# Patient Record
Sex: Female | Born: 1995 | Race: White | Hispanic: No | Marital: Single | Smoking: Never smoker
Health system: Southern US, Community
[De-identification: ages and names within clinical notes are randomized; demographics above are authoritative.]

## PROBLEM LIST (undated history)

## (undated) DIAGNOSIS — G43909 Migraine, unspecified, not intractable, without status migrainosus: Secondary | ICD-10-CM

## (undated) HISTORY — DX: Migraine, unspecified, not intractable, without status migrainosus: G43.909

---
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
## 2017-05-28 LAB — HM HIV SCREENING LAB: HM HIV Screening: NEGATIVE

## 2017-07-16 DIAGNOSIS — E669 Obesity, unspecified: Secondary | ICD-10-CM | POA: Insufficient documentation

## 2018-11-02 DIAGNOSIS — E669 Obesity, unspecified: Secondary | ICD-10-CM

## 2018-11-02 DIAGNOSIS — F909 Attention-deficit hyperactivity disorder, unspecified type: Secondary | ICD-10-CM

## 2018-11-03 ENCOUNTER — Ambulatory Visit (LOCAL_COMMUNITY_HEALTH_CENTER): Payer: Medicaid Other | Admitting: Nurse Practitioner

## 2018-11-03 ENCOUNTER — Encounter: Payer: Self-pay | Admitting: Nurse Practitioner

## 2018-11-03 ENCOUNTER — Other Ambulatory Visit: Payer: Self-pay

## 2018-11-03 VITALS — BP 130/86 | Ht 64.0 in | Wt 191.0 lb

## 2018-11-03 DIAGNOSIS — Z3009 Encounter for other general counseling and advice on contraception: Secondary | ICD-10-CM | POA: Diagnosis not present

## 2018-11-03 DIAGNOSIS — Z30432 Encounter for removal of intrauterine contraceptive device: Secondary | ICD-10-CM | POA: Diagnosis not present

## 2018-11-03 NOTE — Progress Notes (Signed)
   IUD Removal  Client identified, informed consent performed, consent signed.  Patient was in the dorsal lithotomy position, normal external genitalia was noted.  A speculum was placed in the patient's vagina, normal discharge was noted, no lesions. The cervix was visualized, no lesions, no abnormal discharge.  The strings of the IUD were grasped and pulled using ring forceps. The Mirena IUD was removed in its entirety. Intact device shown to client. Client tolerated the procedure well.    Client  - unsure at this time of method -  for contraception/no plans for pregnancy soon and she was told to avoid teratogens, take PNV and folic acid.  Routine preventative health maintenance measures emphasized. Advised client to RTC in 3 months for yearly physical exam Client verbalizes understanding and is in agreement with plan of care.   I

## 2019-01-25 ENCOUNTER — Other Ambulatory Visit: Payer: Self-pay

## 2019-01-25 ENCOUNTER — Ambulatory Visit (LOCAL_COMMUNITY_HEALTH_CENTER): Payer: Medicaid Other

## 2019-01-25 VITALS — BP 140/82 | Ht 65.0 in | Wt 194.0 lb

## 2019-01-25 DIAGNOSIS — Z3009 Encounter for other general counseling and advice on contraception: Secondary | ICD-10-CM | POA: Diagnosis not present

## 2019-01-25 MED ORDER — ULIPRISTAL ACETATE 30 MG PO TABS
30.0000 mg | ORAL_TABLET | Freq: Once | ORAL | Status: AC
  Administered 2019-01-25: 18:00:00 30 mg via ORAL

## 2019-01-25 NOTE — Progress Notes (Signed)
[name] ECP given per SO and to take home PT 2 weeks from now. Aileen Fass, RN

## 2019-05-16 ENCOUNTER — Other Ambulatory Visit: Payer: Medicaid Other

## 2019-08-09 ENCOUNTER — Ambulatory Visit: Payer: Medicaid Other

## 2019-08-10 ENCOUNTER — Ambulatory Visit: Payer: Medicaid Other

## 2019-09-19 ENCOUNTER — Emergency Department
Admission: EM | Admit: 2019-09-19 | Discharge: 2019-09-19 | Disposition: A | Discharge status: Home / Self Care | Payer: Self-pay | Attending: Student | Admitting: Student

## 2019-09-19 ENCOUNTER — Emergency Department: Payer: Self-pay

## 2019-09-19 ENCOUNTER — Encounter: Payer: Self-pay | Admitting: Emergency Medicine

## 2019-09-19 ENCOUNTER — Other Ambulatory Visit: Payer: Self-pay

## 2019-09-19 DIAGNOSIS — B373 Candidiasis of vulva and vagina: Secondary | ICD-10-CM | POA: Insufficient documentation

## 2019-09-19 DIAGNOSIS — R1031 Right lower quadrant pain: Secondary | ICD-10-CM | POA: Insufficient documentation

## 2019-09-19 DIAGNOSIS — B9689 Other specified bacterial agents as the cause of diseases classified elsewhere: Secondary | ICD-10-CM

## 2019-09-19 DIAGNOSIS — N898 Other specified noninflammatory disorders of vagina: Secondary | ICD-10-CM | POA: Insufficient documentation

## 2019-09-19 DIAGNOSIS — N760 Acute vaginitis: Secondary | ICD-10-CM | POA: Insufficient documentation

## 2019-09-19 DIAGNOSIS — A599 Trichomoniasis, unspecified: Secondary | ICD-10-CM | POA: Insufficient documentation

## 2019-09-19 DIAGNOSIS — A549 Gonococcal infection, unspecified: Secondary | ICD-10-CM | POA: Insufficient documentation

## 2019-09-19 DIAGNOSIS — R109 Unspecified abdominal pain: Secondary | ICD-10-CM

## 2019-09-19 DIAGNOSIS — B379 Candidiasis, unspecified: Secondary | ICD-10-CM

## 2019-09-19 LAB — URINALYSIS, COMPLETE (UACMP) WITH MICROSCOPIC
Bilirubin Urine: NEGATIVE
Glucose, UA: NEGATIVE mg/dL
Ketones, ur: 5 mg/dL — AB
Nitrite: NEGATIVE
Protein, ur: 30 mg/dL — AB
Specific Gravity, Urine: 1.031 — ABNORMAL HIGH (ref 1.005–1.030)
WBC, UA: >50 WBC/hpf — ABNORMAL HIGH (ref 0–5)
pH: 6 (ref 5.0–8.0)

## 2019-09-19 LAB — CBC
HCT: 47.5 % — ABNORMAL HIGH (ref 36.0–46.0)
Hemoglobin: 15.3 g/dL — ABNORMAL HIGH (ref 12.0–15.0)
MCH: 26.9 pg (ref 26.0–34.0)
MCHC: 32.2 g/dL (ref 30.0–36.0)
MCV: 83.6 fL (ref 80.0–100.0)
Platelets: 261 10*3/uL (ref 150–400)
RBC: 5.68 MIL/uL — ABNORMAL HIGH (ref 3.87–5.11)
RDW: 13.4 % (ref 11.5–15.5)
WBC: 12.1 10*3/uL — ABNORMAL HIGH (ref 4.0–10.5)
nRBC: 0 % (ref 0.0–0.2)

## 2019-09-19 LAB — COMPREHENSIVE METABOLIC PANEL
ALT: 16 U/L (ref 0–44)
AST: 21 U/L (ref 15–41)
Albumin: 4.2 g/dL (ref 3.5–5.0)
Alkaline Phosphatase: 65 U/L (ref 38–126)
Anion gap: 7 (ref 5–15)
BUN: 9 mg/dL (ref 6–20)
CO2: 28 mmol/L (ref 22–32)
Calcium: 9.3 mg/dL (ref 8.9–10.3)
Chloride: 106 mmol/L (ref 98–111)
Creatinine, Ser: 0.92 mg/dL (ref 0.44–1.00)
GFR calc Af Amer: >60 mL/min (ref >60)
GFR calc non Af Amer: >60 mL/min (ref >60)
Glucose, Bld: 106 mg/dL — ABNORMAL HIGH (ref 70–99)
Potassium: 3.4 mmol/L — ABNORMAL LOW (ref 3.5–5.1)
Sodium: 141 mmol/L (ref 135–145)
Total Bilirubin: 0.7 mg/dL (ref 0.3–1.2)
Total Protein: 7.5 g/dL (ref 6.5–8.1)

## 2019-09-19 LAB — WET PREP, GENITAL: Sperm: NONE SEEN

## 2019-09-19 LAB — CHLAMYDIA/NGC RT PCR (ARMC ONLY)
Chlamydia Tr: NOT DETECTED
N gonorrhoeae: DETECTED — AB

## 2019-09-19 LAB — LIPASE, BLOOD: Lipase: 21 U/L (ref 11–51)

## 2019-09-19 LAB — POCT PREGNANCY, URINE: Preg Test, Ur: NEGATIVE

## 2019-09-19 LAB — HCG, QUANTITATIVE, PREGNANCY: hCG, Beta Chain, Quant, S: <1 m[IU]/mL (ref <5)

## 2019-09-19 MED ORDER — FLUCONAZOLE 50 MG PO TABS
200.0000 mg | ORAL_TABLET | Freq: Once | ORAL | Status: AC
  Administered 2019-09-19: 200 mg via ORAL
  Filled 2019-09-19: qty 4

## 2019-09-19 MED ORDER — METRONIDAZOLE 500 MG PO TABS
500.0000 mg | ORAL_TABLET | Freq: Two times a day (BID) | ORAL | 0 refills | Status: AC
Start: 2019-09-19 — End: 2019-10-03

## 2019-09-19 MED ORDER — AZITHROMYCIN 500 MG PO TABS
1000.0000 mg | ORAL_TABLET | Freq: Once | ORAL | Status: AC
  Administered 2019-09-19: 1000 mg via ORAL
  Filled 2019-09-19: qty 2

## 2019-09-19 MED ORDER — METRONIDAZOLE 500 MG PO TABS
500.0000 mg | ORAL_TABLET | Freq: Once | ORAL | Status: AC
  Administered 2019-09-19: 500 mg via ORAL
  Filled 2019-09-19: qty 1

## 2019-09-19 MED ORDER — CEFTRIAXONE SODIUM 250 MG IJ SOLR
250.0000 mg | Freq: Once | INTRAMUSCULAR | Status: AC
  Administered 2019-09-19: 250 mg via INTRAVENOUS
  Filled 2019-09-19: qty 250

## 2019-09-19 MED ORDER — DEXTROSE 5 % IV SOLN
250.0000 mg | Freq: Once | INTRAVENOUS | Status: AC
  Administered 2019-09-19: 250 mg via INTRAVENOUS
  Filled 2019-09-19: qty 250

## 2019-09-19 MED ORDER — IOHEXOL 300 MG/ML  SOLN
100.0000 mL | Freq: Once | INTRAMUSCULAR | Status: AC | PRN
  Administered 2019-09-19: 100 mL via INTRAVENOUS
  Filled 2019-09-19: qty 100

## 2019-09-19 MED ORDER — DOXYCYCLINE HYCLATE 100 MG PO CAPS
100.0000 mg | ORAL_CAPSULE | Freq: Two times a day (BID) | ORAL | 0 refills | Status: AC
Start: 2019-09-19 — End: 2019-10-03

## 2019-09-19 MED ORDER — SODIUM CHLORIDE 0.9% FLUSH: 3.0000 mL | Freq: Once | INTRAVENOUS | Status: DC

## 2019-09-19 MED ORDER — FLUCONAZOLE 200 MG PO TABS
200.0000 mg | ORAL_TABLET | Freq: Once | ORAL | 0 refills | Status: AC
Start: 2019-10-03 — End: 2019-10-03

## 2019-09-19 NOTE — ED Notes (Signed)
Pt transported to [facility]

## 2019-09-19 NOTE — ED Provider Notes (Signed)
[facility] Emergency Department Provider Note  ____________________________________________   First MD Initiated Contact with Patient 09/19/19 1704     (approximate)  I have reviewed the triage vital signs and the nursing notes.  History  Chief Complaint Abdominal Pain    HPI Lauren Taylor is a 24 y.o. female PMHx as below, who presents to the ER for lower abdominal cramping pain, primarily R sided, as well as positive home pregnancy test. Patient states lower abdominal cramping first started over the weekend, located more so in the RLQ.  Described as cramping, 6/10 in severity, no radiation, no alleviating or aggravating components. Constant since onset.  Did has an episode of vomiting over the weekend, none since.  Took a home pregnancy test which was faintly positive.  Denies any vaginal bleeding.  Does report some vaginal discharge, but not out of the ordinary for her. LMP October 2020, though she is irregular and recently had IUD removed. Does ask for STD screening and empiric treatment, however initially declines pelvic exam.  No fevers, no dysuria.   Past Medical Hx Past Medical History:  Diagnosis Date  . Migraine     Problem List Patient Active Problem List   Diagnosis Date Noted  . Obesity, unspecified 07/16/2017  . Attention deficit hyperactivity disorder 01/27/2016  . Acute depression 07/18/2014    Past Surgical Hx History reviewed. No pertinent surgical history.  Medications Prior to Admission medications   Medication Sig Start Date End Date Taking? Authorizing Provider  phenazopyridine (PYRIDIUM) 200 MG tablet Take 1 tablet (200 mg total) by mouth 3 (three) times daily as needed for pain. 10/20/15   Menshew, Dannielle Karvonen, PA-C    Allergies Patient has no known allergies.  Family Hx Family History  Problem Relation Age of Onset  . Anxiety disorder Mother   . Depression Mother     Social Hx Social History   Tobacco Use  .  Smoking status: Never Smoker  . Smokeless tobacco: Never Used  Substance Use Topics  . Alcohol use: Not Currently  . Drug use: Not Currently     Review of Systems  Constitutional: Negative for fever. Negative for chills. Eyes: Negative for visual changes. ENT: Negative for sore throat. Cardiovascular: Negative for chest pain. Respiratory: Negative for shortness of breath. Gastrointestinal: Negative for nausea. Negative for vomiting. + abdominal cramping Genitourinary: Negative for dysuria. Musculoskeletal: Negative for leg swelling. Skin: Negative for rash. Neurological: Negative for headaches.   Physical Exam  Vital Signs: ED Triage Vitals  Enc Vitals Group     BP 09/19/19 1604 126/81     Pulse Rate 09/19/19 1604 (!) 101     Resp 09/19/19 1604 20     Temp 09/19/19 1604 98.9 F (37.2 C)     Temp Source 09/19/19 1604 Oral     SpO2 09/19/19 1604 99 %     Weight 09/19/19 1559 175 lb (79.4 kg)     Height 09/19/19 1559 5\' 3"  (1.6 m)     Head Circumference --      Peak Flow --      Pain Score 09/19/19 1559 6     Pain Loc --      Pain Edu? --      Excl. in [place]? --     Constitutional: Alert and oriented. Well appearing. NAD.  Head: Normocephalic. Atraumatic. Eyes: Conjunctivae clear. Sclera anicteric. Pupils equal and symmetric. Nose: No masses or lesions. No congestion or rhinorrhea. Mouth/Throat: Wearing mask.  Neck:  No stridor. Trachea midline.  Cardiovascular: Normal rate, regular rhythm. Extremities well perfused. Respiratory: Normal respiratory effort.  Lungs CTAB. Gastrointestinal: Soft. Non-distended.  Mild discomfort with palpation in the RLQ, but no rebound, guarding, rigidity.  Remainder abdomen soft and nontender. Genitourinary: RN chaperone present. Normal external genitalia, no rashes or lesions. Moderate amount of discharge in vaginal canal -  thick, white, clumpy discharge mixed w/ thinner white discharge. No CMT. No adnexa tenderness.  Musculoskeletal: No  lower extremity edema. No deformities. Neurologic:  Normal speech and language. No gross focal or lateralizing neurologic deficits are appreciated.  Skin: Skin is warm, dry and intact. No rash noted. Psychiatric: Mood and affect are appropriate for situation.    Radiology  Personally reviewed available imaging myself.   CT A/P - IMPRESSION:  No acute process in the abdomen or pelvis. '  US - IMPRESSION:  Findings likely consistent with a small uterine fibroid.    Procedures  Procedure(s) performed (including critical care):  Procedures   Initial Impression / Assessment and Plan / MDM / ED Course  24 y.o. female who presents to the ED for right lower quadrant abdominal cramping, and positive home pregnancy test  Ddx: pregnancy, ectopic, appendicitis, pelvic infection, UTI, kidney stone  Will plan for labs, UA, imaging.  Discussed pelvic exam with patient, at this time she would prefer to obtain UA gonorrhea/chlamydia.  Pregnancy test is negative.  CT abdomen/pelvis negative for acute abnormalities.  Urine positive for gonorrhea, negative for chlamydia.  UA with WBCs, bacteria, LE likely secondary to gonorrhea.  Azithromycin and ceftriaxone ordered.  Updated patient on results, discussed the need for further evaluation with pelvic ultrasound to rule out TOA given her right-sided pain.  At this time, she is agreeable to pelvic exam for further/more complete evaluation, and wet prep swab.   Pelvic US reveals probably small fibroid, otherwise unremarkable. Wet prep is positive for yeast, trich, and clue cells. Will treat BV and trich with course of metronidazole. Will treat yeast w/ fluconazole here, and additional Rx for 2nd dose after completion of antibiotics. Given her multiple infections, will treat with doxy x 14 days as well. She is in agreement. She is aware of notifying any partner(s) as well for treatment. Advised outpatient follow up and given return precautions.        _______________________________   As part of my medical decision making I have reviewed available labs, radiology tests, reviewed old records/performed chart review.    Final Clinical Impression(s) / ED Diagnosis  Final diagnoses:  Abdominal cramping  Gonorrhea  Yeast infection  Trichomoniasis  Bacterial vaginosis       Note:  This document was prepared using Dragon voice recognition software and may include unintentional dictation errors.   Miguel Aschoff., MD 09/20/19 1044

## 2019-09-19 NOTE — ED Triage Notes (Signed)
Pt presents to ED via POV with c/o lower abdominal cramping. Pt states took 3 home preg tests on Monday that were +, reports today had lower abdominal cramping. Denies vaginal bleeding at this time.

## 2019-09-19 NOTE — Discharge Instructions (Addendum)
Thank you for letting us take care of you in the emergency department today.  Your testing was positive for gonorrhea, yeast, bacterial vaginosis, and trichomonas.  We will be sending you home with prescription medications for these.  It is also important that you update your partner(s) on your positive results, as they will need to be treated as well.  Please continue to take any regular, prescribed medications.   New medications we have prescribed:  Doxycycline Metronidazole (Flagyl) Fluconazole - this is for treatment of yeast infection, you should take this after completion of antibiotics  Please follow up with: Your primary care doctor to review your ER visit and follow up on your symptoms.    Please return to the ER for any new or worsening symptoms.

## 2019-09-22 ENCOUNTER — Ambulatory Visit: Payer: Medicaid Other

## 2019-12-15 ENCOUNTER — Ambulatory Visit: Payer: Medicaid Other

## 2020-07-10 IMAGING — US US TRANSVAGINAL NON-OB
1 series · 14 of 25 positions shown · non-contrast
Comparison: None.

CLINICAL DATA: Right upper quadrant pain.

EXAM:
TRANSABDOMINAL ULTRASOUND OF PELVIS
DOPPLER ULTRASOUND OF OVARIES
TECHNIQUE: Transabdominal ultrasound examination of the pelvis was performed
including evaluation of the uterus, ovaries, adnexal regions, and
pelvic cul-de-sac.
Color and duplex Doppler ultrasound was utilized to evaluate blood
flow to the ovaries.

[Series 1: us pelvis (transabdominal only) · 14 of 42 slices shown]
[im 1/42]
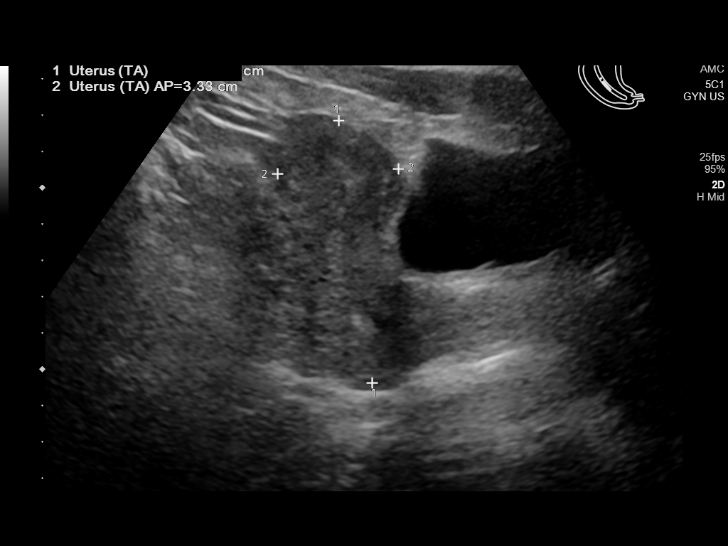
[im 4/42]
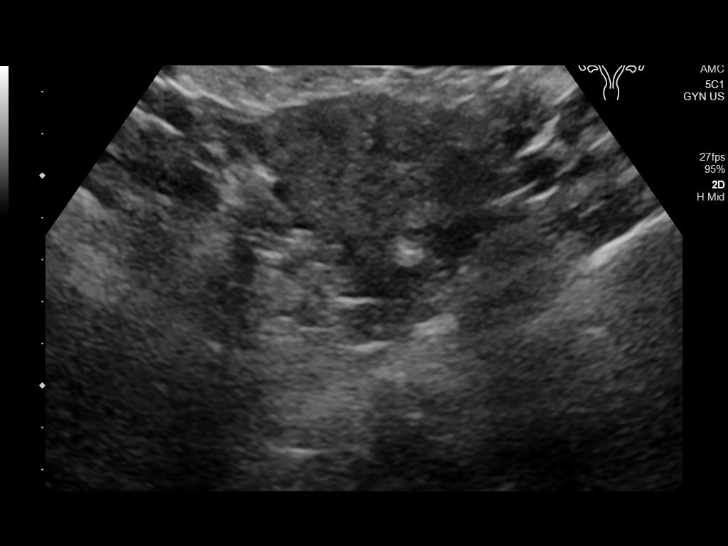
[im 7/42]
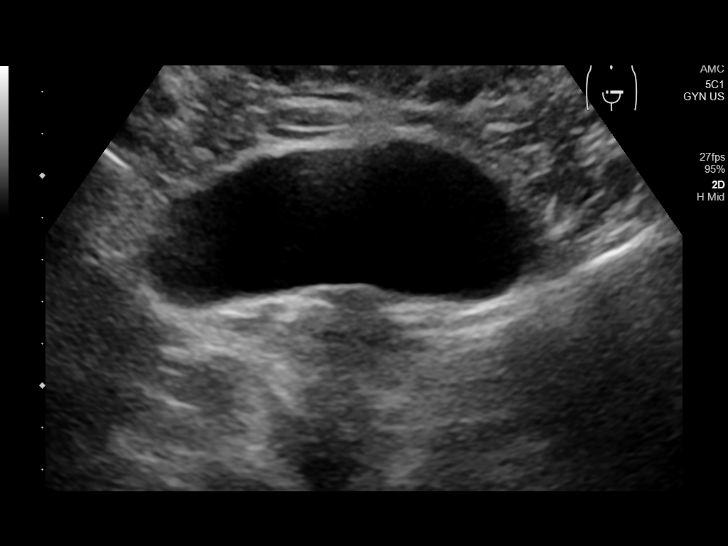
[im 11/42]
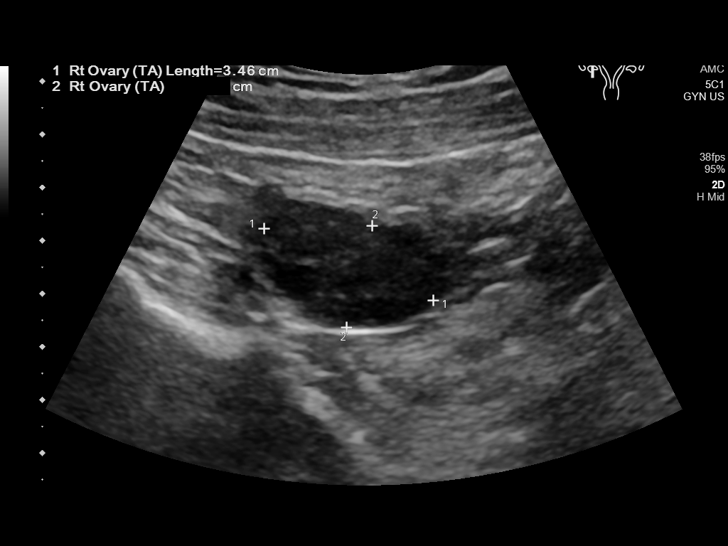
[im 14/42]
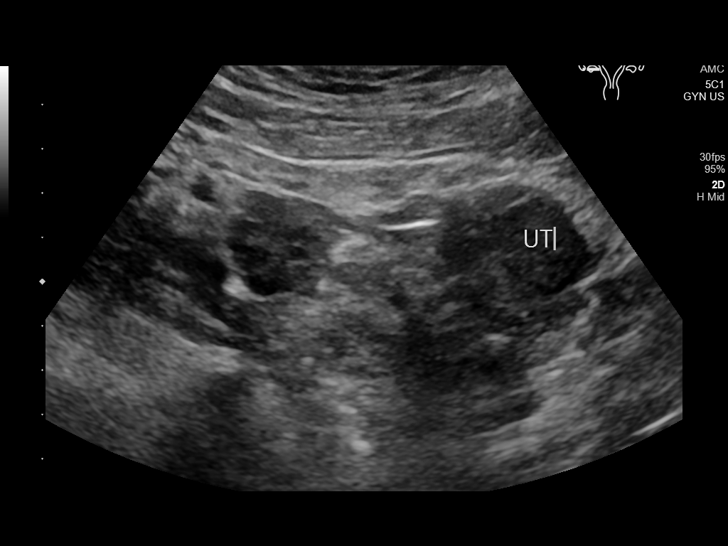
[im 16/42]
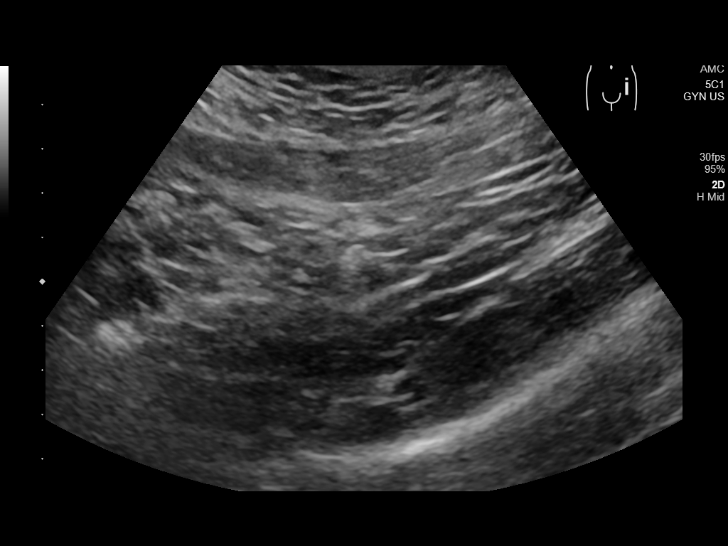
[im 19/42]
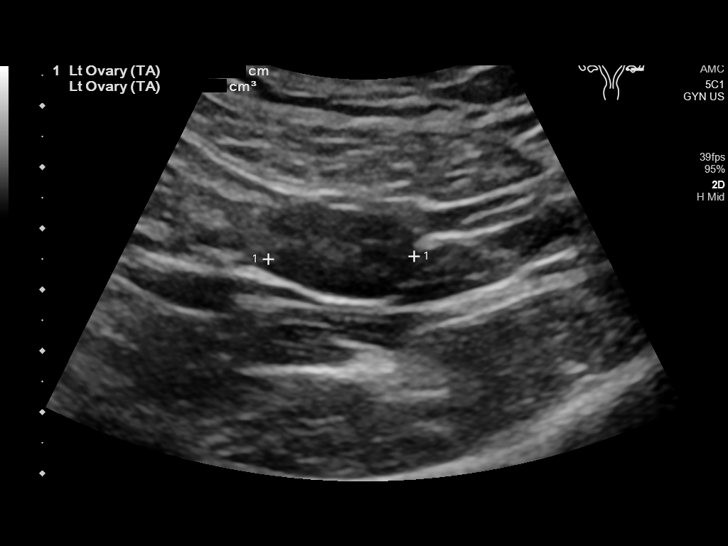
[im 23/42]
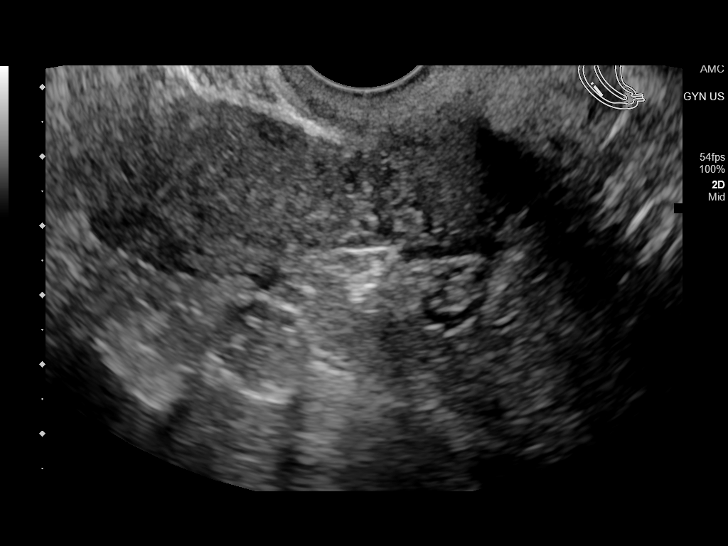
[im 26/42]
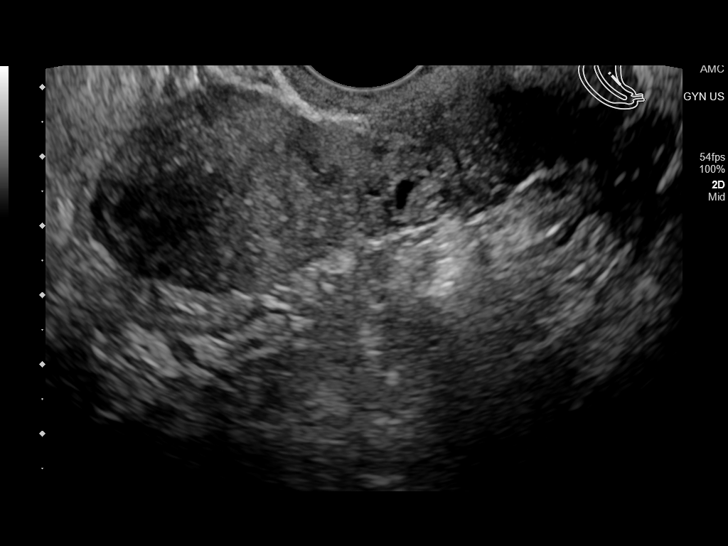
[im 28/42]
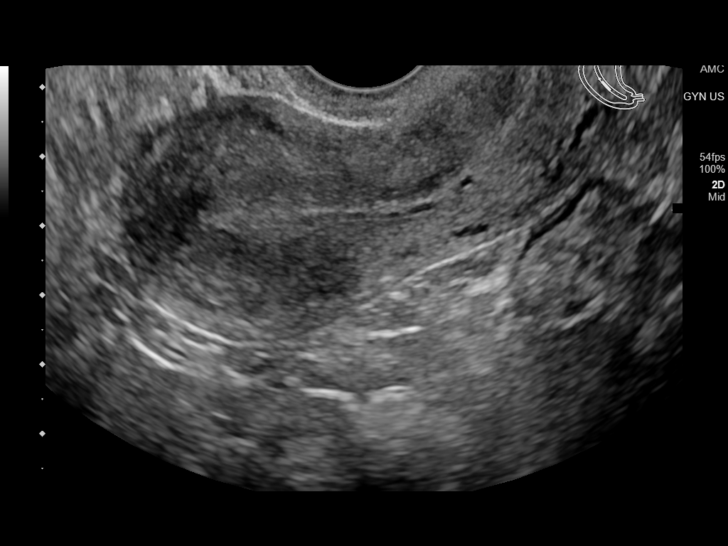
[im 31/42]
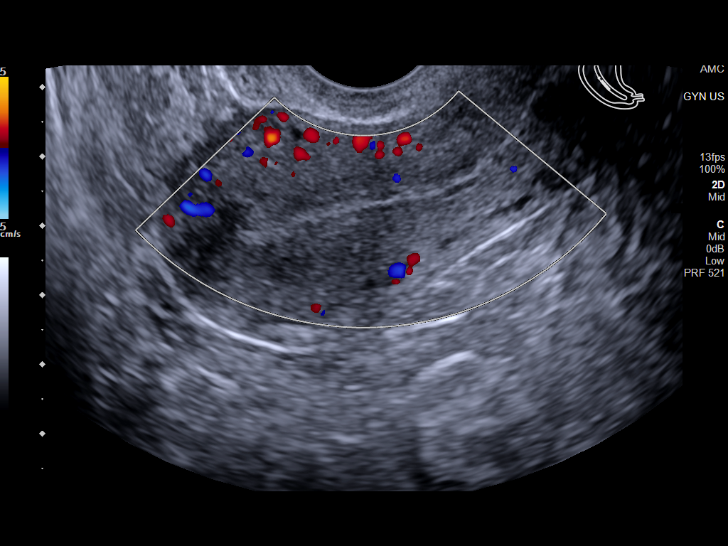
[im 35/42]
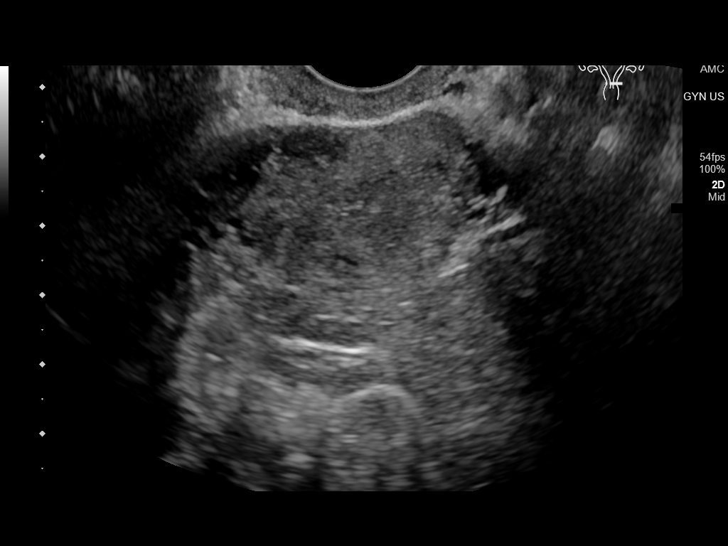
[im 38/42]
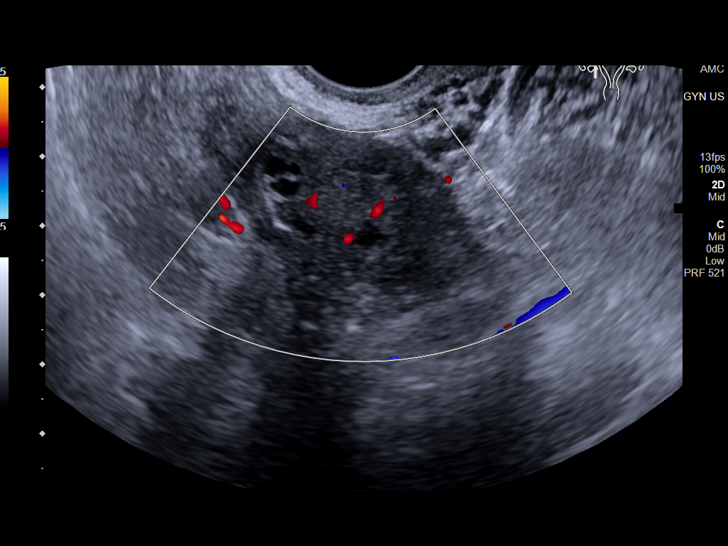
[im 42/42]
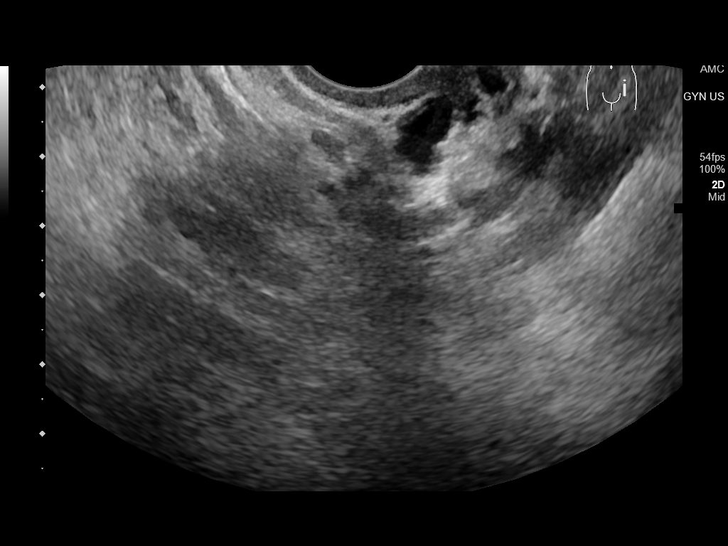

[14 of 25 positions shown; findings below may reference images not displayed]

FINDINGS: Uterus

Measurements: 7.3 cm x 3.3 cm x 4.4 cm = volume: 56 mL. A 0.7 cm x
0.7 cm x 0.7 cm area of heterogeneous echogenicity is seen within
the uterine parenchyma on the right.

Endometrium

Thickness: 4.0 mm.  No focal abnormality visualized.

Right ovary

Measurements: 3.5 cm x 2.0 cm x 2.2 cm = volume: 8 mL. Normal
appearance/no adnexal mass.

Left ovary

Measurements: 3.6 cm x 1.6 cm x 2.4 cm = volume: 7 mL. Normal
appearance/no adnexal mass.

Pulsed Doppler evaluation demonstrates normal low-resistance
arterial and venous waveforms in both ovaries.

Other: No free fluid is seen.
IMPRESSION: Findings likely consistent with a small uterine fibroid.

## 2021-05-04 NOTE — L&D Delivery Note (Signed)
Delivery Note Primary OB: other Delivery Physician: Barnett Applebaum, MD Gestational Age: Premature at [redacted] weeks gestation Antepartum complications: PPROM, PTL/PTD Intrapartum complications: Preterm Labor and PROM  A viable Female was delivered via vertex presentation. "Analayah" Apgars:pending; infant w neonatal team  Weight:  1 lb 15 oz .   Placenta status: spontaneous and Intact.  One small area of clot, otherwise healthy size and appearance  Cord: 3+ vessels;  with the following complications: none.  Anesthesia:  epidural Episiotomy:  none Lacerations:  none Suture Repair: none Est. Blood Loss (mL):   500 mL  Mom to postpartum.  Baby to NICU/SCN.  Barnett Applebaum, MD, [facility] Pardon [facility], [facility] 06/27/2021  8:17 AM [phone]

## 2021-06-24 ENCOUNTER — Encounter: Payer: Self-pay | Admitting: Obstetrics and Gynecology

## 2021-06-24 ENCOUNTER — Observation Stay
Admission: EM | Admit: 2021-06-24 | Discharge: 2021-06-24 | Disposition: A | Discharge status: Home / Self Care | Payer: Medicaid Other | Attending: Obstetrics and Gynecology | Admitting: Obstetrics and Gynecology

## 2021-06-24 ENCOUNTER — Other Ambulatory Visit: Payer: Self-pay

## 2021-06-24 DIAGNOSIS — O212 Late vomiting of pregnancy: Secondary | ICD-10-CM | POA: Diagnosis present

## 2021-06-24 DIAGNOSIS — R82998 Other abnormal findings in urine: Secondary | ICD-10-CM | POA: Diagnosis not present

## 2021-06-24 DIAGNOSIS — Z3A26 26 weeks gestation of pregnancy: Secondary | ICD-10-CM | POA: Diagnosis not present

## 2021-06-24 DIAGNOSIS — E876 Hypokalemia: Secondary | ICD-10-CM | POA: Insufficient documentation

## 2021-06-24 DIAGNOSIS — O219 Vomiting of pregnancy, unspecified: Secondary | ICD-10-CM | POA: Diagnosis present

## 2021-06-24 DIAGNOSIS — O99282 Endocrine, nutritional and metabolic diseases complicating pregnancy, second trimester: Secondary | ICD-10-CM | POA: Diagnosis not present

## 2021-06-24 LAB — URINALYSIS, COMPLETE (UACMP) WITH MICROSCOPIC
Bilirubin Urine: NEGATIVE
Glucose, UA: NEGATIVE mg/dL
Ketones, ur: 80 mg/dL — AB
Nitrite: NEGATIVE
Protein, ur: 100 mg/dL — AB
Specific Gravity, Urine: 1.029 (ref 1.005–1.030)
WBC, UA: >50 WBC/hpf — ABNORMAL HIGH (ref 0–5)
pH: 5 (ref 5.0–8.0)

## 2021-06-24 LAB — COMPREHENSIVE METABOLIC PANEL
ALT: 27 U/L (ref 0–44)
AST: 38 U/L (ref 15–41)
Albumin: 3.2 g/dL — ABNORMAL LOW (ref 3.5–5.0)
Alkaline Phosphatase: 70 U/L (ref 38–126)
Anion gap: 10 (ref 5–15)
BUN: 8 mg/dL (ref 6–20)
CO2: 20 mmol/L — ABNORMAL LOW (ref 22–32)
Calcium: 8.2 mg/dL — ABNORMAL LOW (ref 8.9–10.3)
Chloride: 107 mmol/L (ref 98–111)
Creatinine, Ser: 0.57 mg/dL (ref 0.44–1.00)
GFR, Estimated: >60 mL/min (ref >60)
Glucose, Bld: 105 mg/dL — ABNORMAL HIGH (ref 70–99)
Potassium: 3 mmol/L — ABNORMAL LOW (ref 3.5–5.1)
Sodium: 137 mmol/L (ref 135–145)
Total Bilirubin: 0.5 mg/dL (ref 0.3–1.2)
Total Protein: 6.9 g/dL (ref 6.5–8.1)

## 2021-06-24 LAB — CBC
HCT: 34.5 % — ABNORMAL LOW (ref 36.0–46.0)
Hemoglobin: 11.4 g/dL — ABNORMAL LOW (ref 12.0–15.0)
MCH: 27 pg (ref 26.0–34.0)
MCHC: 33 g/dL (ref 30.0–36.0)
MCV: 81.8 fL (ref 80.0–100.0)
Platelets: 160 10*3/uL (ref 150–400)
RBC: 4.22 MIL/uL (ref 3.87–5.11)
RDW: 13.1 % (ref 11.5–15.5)
WBC: 8.9 10*3/uL (ref 4.0–10.5)
nRBC: 0 % (ref 0.0–0.2)

## 2021-06-24 MED ORDER — PROMETHAZINE HCL 25 MG PO TABS: 12.5000 mg | ORAL_TABLET | Freq: Four times a day (QID) | ORAL | 0 refills | Status: DC | PRN

## 2021-06-24 MED ORDER — ONDANSETRON HCL 4 MG/2ML IJ SOLN
4.0000 mg | Freq: Four times a day (QID) | INTRAMUSCULAR | Status: DC | PRN
  Administered 2021-06-24: 4 mg via INTRAVENOUS
  Filled 2021-06-24: qty 2

## 2021-06-24 MED ORDER — SODIUM CHLORIDE 0.9 % IV SOLN
12.5000 mg | Freq: Once | INTRAVENOUS | Status: AC
  Filled 2021-06-24: qty 0.5

## 2021-06-24 MED ORDER — PROMETHAZINE HCL 25 MG/ML IJ SOLN
INTRAMUSCULAR | Status: AC
  Administered 2021-06-24: 12.5 mg
  Filled 2021-06-24: qty 1

## 2021-06-24 MED ORDER — LACTATED RINGERS IV SOLN: INTRAVENOUS | Status: DC

## 2021-06-24 NOTE — OB Triage Note (Signed)
Pt is a G2P1 at 72.5 w who receives [facility] who presents with N/V. Since 06/23/2021 at 1900. Still feels nauseated and stated vomited about 2 min ago and now has blood sputum. Pt states uncomplicated pregnancy with no N/V. Pt denies exposure to anyone sick. Reports + FM

## 2021-06-24 NOTE — Discharge Summary (Signed)
Physician Final Progress Note  Patient ID: Lauren Taylor MRN: [id number] DOB/AGE: 1995-08-19 26 y.o.  Admit date: 06/24/2021 Admitting provider: Rod Can, CNM Discharge date: 06/24/2021   Admission Diagnoses:  1) intrauterine pregnancy at [redacted]w[redacted]d  2) nausea and vomiting 3) hypokalemia  Discharge Diagnoses:  Principal Problem:   Nausea and vomiting during pregnancy Active Problems:   [redacted] weeks gestation of pregnancy    History of Present Illness: The patient is a 27 y.o. female G2P1001 at [redacted]w[redacted]d who presents for nausea and vomiting since last night. She reports blood in emesis. She denies upper respiratory symptoms. She denies exposure to sickness, although her partner who is in the room reports that yesterday he had diarrhea and did not feel well. She has been unable to keep down food or fluids. She reports good fetal movement. She denies vaginal bleeding, leakage of fluid or contractions. She was admitted for observation, placed on monitors, labs done. Discussed results of labs with patient and recommendations given; rest, hydrate, supplement with potassium. Follow up as needed after urine culture results.  Past Medical History:  Diagnosis Date   Migraine     No past surgical history on file.  No current facility-administered medications on file prior to encounter.   No current outpatient medications on file prior to encounter.    No Known Allergies  Social History   Socioeconomic History   Marital status: Single    Spouse name: Not on file   Number of children: Not on file   Years of education: Not on file   Highest education level: Not on file  Occupational History   Not on file  Tobacco Use   Smoking status: Never   Smokeless tobacco: Never  Substance and Sexual Activity   Alcohol use: Not Currently   Drug use: Not Currently   Sexual activity: Not on file  Other Topics Concern   Not on file  Social History Narrative   Not on file   Social Determinants of  Health   Financial Resource Strain: Not on file  Food Insecurity: Not on file  Transportation Needs: Not on file  Physical Activity: Not on file  Stress: Not on file  Social Connections: Not on file  Intimate Partner Violence: Not on file    Family History  Problem Relation Age of Onset   Anxiety disorder Mother    Depression Mother      Review of Systems  Constitutional:  Positive for fever. Negative for chills.  HENT:  Negative for congestion, ear discharge, ear pain, hearing loss, sinus pain and sore throat.   Eyes:  Negative for blurred vision and double vision.  Respiratory:  Negative for cough, shortness of breath and wheezing.   Cardiovascular:  Negative for chest pain, palpitations and leg swelling.  Gastrointestinal:  Positive for nausea and vomiting. Negative for abdominal pain, blood in stool, constipation, diarrhea, heartburn and melena.  Genitourinary:  Negative for dysuria, flank pain, frequency, hematuria and urgency.  Musculoskeletal:  Negative for back pain, joint pain and myalgias.  Skin:  Negative for itching and rash.  Neurological:  Negative for dizziness, tingling, tremors, sensory change, speech change, focal weakness, seizures, loss of consciousness, weakness and headaches.  Endo/Heme/Allergies:  Negative for environmental allergies. Does not bruise/bleed easily.  Psychiatric/Behavioral:  Negative for depression, hallucinations, memory loss, substance abuse and suicidal ideas. The patient is not nervous/anxious and does not have insomnia.     Physical Exam: BP 123/69    Pulse (!) 112  Temp 99.7 F (37.6 C)    Resp 18   Vital Signs: BP 123/69    Pulse (!) 112    Temp 99.7 F (37.6 C)    Resp 18  Constitutional: Well nourished, well developed female in mild acute distress.  HEENT: normal Skin: Warm and dry.  Cardiovascular: Regular rate and rhythm.   Extremity:  no edema   Respiratory: Clear to auscultation bilateral. Normal respiratory  effort Abdomen: FHT present Neuro: DTRs 2+, Cranial nerves grossly intact Psych: Alert and Oriented x3. No memory deficits. Normal mood and affect.   Fetal well being: 150 bpm, moderate variability, no accelerations, no decelerations Toco: negative  Consults: None  Significant Findings/ Diagnostic Studies: labs:   Latest Reference Range & Units 06/24/21 04:17  COMPREHENSIVE METABOLIC PANEL  Rpt !  Sodium 135 - 145 mmol/L 137  Potassium 3.5 - 5.1 mmol/L 3.0 (L)  Chloride 98 - 111 mmol/L 107  CO2 22 - 32 mmol/L 20 (L)  Glucose 70 - 99 mg/dL 105 (H)  BUN 6 - 20 mg/dL 8  Creatinine 0.44 - 1.00 mg/dL 0.57  Calcium 8.9 - 10.3 mg/dL 8.2 (L)  Anion gap 5 - 15  10  Alkaline Phosphatase 38 - 126 U/L 70  Albumin 3.5 - 5.0 g/dL 3.2 (L)  AST 15 - 41 U/L 38  ALT 0 - 44 U/L 27  Total Protein 6.5 - 8.1 g/dL 6.9  Total Bilirubin 0.3 - 1.2 mg/dL 0.5  GFR, Estimated >60 mL/min >60  WBC 4.0 - 10.5 K/uL 8.9  RBC 3.87 - 5.11 MIL/uL 4.22  Hemoglobin 12.0 - 15.0 g/dL 11.4 (L)  HCT 36.0 - 46.0 % 34.5 (L)  MCV 80.0 - 100.0 fL 81.8  MCH 26.0 - 34.0 pg 27.0  MCHC 30.0 - 36.0 g/dL 33.0  RDW 11.5 - 15.5 % 13.1  Platelets 150 - 400 K/uL 160  nRBC 0.0 - 0.2 % 0.0  Appearance CLEAR  TURBID !  Bilirubin Urine NEGATIVE  NEGATIVE  Color, Urine YELLOW  AMBER !  Glucose, UA NEGATIVE mg/dL NEGATIVE  Hgb urine dipstick NEGATIVE  SMALL !  Ketones, ur NEGATIVE mg/dL 80 !  Leukocytes,Ua NEGATIVE  MODERATE !  Nitrite NEGATIVE  NEGATIVE  pH 5.0 - 8.0  5.0  Protein NEGATIVE mg/dL 100 !  Specific Gravity, Urine 1.005 - 1.030  1.029  Bacteria, UA NONE SEEN  MANY !  Mucus  PRESENT  RBC / HPF 0 - 5 RBC/hpf 21-50  Squamous Epithelial / LPF 0 - 5  11-20  WBC Clumps  PRESENT  WBC, UA 0 - 5 WBC/hpf >50 (H)  URINE CULTURE  Rpt  !: Data is abnormal (L): Data is abnormally low (H): Data is abnormally high Rpt: View report in Results Review for more information  Procedures: NST  Hospital Course: The patient  was admitted to Labor and Delivery Triage for observation.   Discharge Condition: good  Disposition: Discharge disposition: 01-Home or Self Care  Diet: advance as tolerated  Discharge Activity: Activity as tolerated  Discharge Instructions     Discharge activity:   Complete by: As directed    Activity as tolerated   Discharge diet:   Complete by: As directed    Advance diet as tolerated      Allergies as of 06/24/2021   No Known Allergies      Medication List     STOP taking these medications    phenazopyridine 200 MG tablet Commonly known as: Pyridium  TAKE these medications    promethazine 25 MG tablet Commonly known as: PHENERGAN Take 0.5 tablets (12.5 mg total) by mouth every 6 (six) hours as needed for nausea or vomiting.        Total time spent taking care of this patient: 21 minutes  Signed: Rod Can, CNM  06/24/2021, 7:17 AM

## 2021-06-25 LAB — URINE CULTURE

## 2021-06-26 DIAGNOSIS — O99214 Obesity complicating childbirth: Secondary | ICD-10-CM | POA: Diagnosis present

## 2021-06-26 DIAGNOSIS — O4592 Premature separation of placenta, unspecified, second trimester: Secondary | ICD-10-CM | POA: Diagnosis present

## 2021-06-26 DIAGNOSIS — Z3A27 27 weeks gestation of pregnancy: Secondary | ICD-10-CM

## 2021-06-26 DIAGNOSIS — O42912 Preterm premature rupture of membranes, unspecified as to length of time between rupture and onset of labor, second trimester: Principal | ICD-10-CM | POA: Diagnosis present

## 2021-06-26 DIAGNOSIS — Z20822 Contact with and (suspected) exposure to covid-19: Secondary | ICD-10-CM | POA: Diagnosis not present

## 2021-06-26 DIAGNOSIS — R109 Unspecified abdominal pain: Secondary | ICD-10-CM | POA: Diagnosis present

## 2021-06-27 ENCOUNTER — Other Ambulatory Visit: Payer: Self-pay

## 2021-06-27 ENCOUNTER — Inpatient Hospital Stay: Payer: Medicaid Other | Admitting: Anesthesiology

## 2021-06-27 ENCOUNTER — Encounter: Payer: Self-pay | Admitting: Obstetrics and Gynecology

## 2021-06-27 ENCOUNTER — Inpatient Hospital Stay
Admission: EM | Admit: 2021-06-27 | Discharge: 2021-06-27 | DRG: 831 | Disposition: A | Discharge status: Home / Self Care | Payer: Medicaid Other | Attending: Obstetrics & Gynecology | Admitting: Obstetrics & Gynecology

## 2021-06-27 DIAGNOSIS — R109 Unspecified abdominal pain: Secondary | ICD-10-CM | POA: Diagnosis present

## 2021-06-27 DIAGNOSIS — Z3A27 27 weeks gestation of pregnancy: Secondary | ICD-10-CM

## 2021-06-27 DIAGNOSIS — O42919 Preterm premature rupture of membranes, unspecified as to length of time between rupture and onset of labor, unspecified trimester: Secondary | ICD-10-CM | POA: Diagnosis present

## 2021-06-27 DIAGNOSIS — O42912 Preterm premature rupture of membranes, unspecified as to length of time between rupture and onset of labor, second trimester: Secondary | ICD-10-CM | POA: Diagnosis present

## 2021-06-27 DIAGNOSIS — O42013 Preterm premature rupture of membranes, onset of labor within 24 hours of rupture, third trimester: Secondary | ICD-10-CM | POA: Diagnosis not present

## 2021-06-27 DIAGNOSIS — R1031 Right lower quadrant pain: Secondary | ICD-10-CM | POA: Diagnosis not present

## 2021-06-27 DIAGNOSIS — O99214 Obesity complicating childbirth: Secondary | ICD-10-CM | POA: Diagnosis present

## 2021-06-27 DIAGNOSIS — O26893 Other specified pregnancy related conditions, third trimester: Secondary | ICD-10-CM | POA: Diagnosis not present

## 2021-06-27 DIAGNOSIS — R1030 Lower abdominal pain, unspecified: Secondary | ICD-10-CM | POA: Diagnosis present

## 2021-06-27 DIAGNOSIS — Z20822 Contact with and (suspected) exposure to covid-19: Secondary | ICD-10-CM | POA: Diagnosis present

## 2021-06-27 DIAGNOSIS — R1032 Left lower quadrant pain: Secondary | ICD-10-CM | POA: Diagnosis not present

## 2021-06-27 DIAGNOSIS — O4592 Premature separation of placenta, unspecified, second trimester: Secondary | ICD-10-CM | POA: Diagnosis present

## 2021-06-27 LAB — URINALYSIS, ROUTINE W REFLEX MICROSCOPIC
Bilirubin Urine: NEGATIVE
Glucose, UA: NEGATIVE mg/dL
Ketones, ur: 80 mg/dL — AB
Nitrite: NEGATIVE
Protein, ur: NEGATIVE mg/dL
Specific Gravity, Urine: 1.023 (ref 1.005–1.030)
pH: 6 (ref 5.0–8.0)

## 2021-06-27 LAB — RESP PANEL BY RT-PCR (FLU A&B, COVID) ARPGX2
Influenza A by PCR: NEGATIVE
Influenza B by PCR: NEGATIVE
SARS Coronavirus 2 by RT PCR: NEGATIVE

## 2021-06-27 LAB — CBC
HCT: 29.3 % — ABNORMAL LOW (ref 36.0–46.0)
Hemoglobin: 10 g/dL — ABNORMAL LOW (ref 12.0–15.0)
MCH: 27.2 pg (ref 26.0–34.0)
MCHC: 34.1 g/dL (ref 30.0–36.0)
MCV: 79.8 fL — ABNORMAL LOW (ref 80.0–100.0)
Platelets: 145 10*3/uL — ABNORMAL LOW (ref 150–400)
RBC: 3.67 MIL/uL — ABNORMAL LOW (ref 3.87–5.11)
RDW: 12.6 % (ref 11.5–15.5)
WBC: 10.5 10*3/uL (ref 4.0–10.5)
nRBC: 0 % (ref 0.0–0.2)

## 2021-06-27 LAB — TYPE AND SCREEN
ABO/RH(D): O POS
Antibody Screen: NEGATIVE

## 2021-06-27 LAB — RPR: RPR Ser Ql: NONREACTIVE

## 2021-06-27 LAB — FETAL FIBRONECTIN: Fetal Fibronectin: POSITIVE — AB

## 2021-06-27 MED ORDER — COCONUT OIL OIL
1.0000 "application " | TOPICAL_OIL | Status: DC | PRN
  Filled 2021-06-27: qty 120

## 2021-06-27 MED ORDER — NIFEDIPINE 10 MG PO CAPS: 20.0000 mg | ORAL_CAPSULE | ORAL | Status: DC | PRN

## 2021-06-27 MED ORDER — DIPHENHYDRAMINE HCL 25 MG PO CAPS
25.0000 mg | ORAL_CAPSULE | Freq: Four times a day (QID) | ORAL | Status: DC | PRN
Start: 2021-06-27 — End: 2021-06-27

## 2021-06-27 MED ORDER — PHENYLEPHRINE 40 MCG/ML (10ML) SYRINGE FOR IV PUSH (FOR BLOOD PRESSURE SUPPORT): 80.0000 ug | PREFILLED_SYRINGE | INTRAVENOUS | Status: DC | PRN

## 2021-06-27 MED ORDER — LACTATED RINGERS IV SOLN: INTRAVENOUS | Status: DC

## 2021-06-27 MED ORDER — AMMONIA AROMATIC IN INHA
RESPIRATORY_TRACT | Status: AC
  Filled 2021-06-27: qty 10

## 2021-06-27 MED ORDER — METHYLERGONOVINE MALEATE 0.2 MG/ML IJ SOLN
0.2000 mg | Freq: Once | INTRAMUSCULAR | Status: AC
  Administered 2021-06-27: 0.2 mg via INTRAMUSCULAR

## 2021-06-27 MED ORDER — DIPHENHYDRAMINE HCL 50 MG/ML IJ SOLN: 12.5000 mg | INTRAMUSCULAR | Status: DC | PRN

## 2021-06-27 MED ORDER — OXYTOCIN-SODIUM CHLORIDE 30-0.9 UT/500ML-% IV SOLN
2.5000 [IU]/h | INTRAVENOUS | Status: DC
  Administered 2021-06-27: 2.5 [IU]/h via INTRAVENOUS

## 2021-06-27 MED ORDER — LIDOCAINE HCL (PF) 1 % IJ SOLN
INTRAMUSCULAR | Status: AC
  Filled 2021-06-27: qty 30

## 2021-06-27 MED ORDER — LIDOCAINE HCL (PF) 1 % IJ SOLN
INTRAMUSCULAR | Status: DC | PRN
  Administered 2021-06-27: 1 mL

## 2021-06-27 MED ORDER — MAGNESIUM SULFATE 40 GM/1000ML IV SOLN
2.0000 g/h | INTRAVENOUS | Status: DC
  Administered 2021-06-27: 2 g/h via INTRAVENOUS
  Filled 2021-06-27: qty 1000

## 2021-06-27 MED ORDER — IBUPROFEN 600 MG PO TABS
600.0000 mg | ORAL_TABLET | Freq: Four times a day (QID) | ORAL | Status: DC
  Administered 2021-06-27: 600 mg via ORAL
  Filled 2021-06-27 (×2): qty 1

## 2021-06-27 MED ORDER — METHYLERGONOVINE MALEATE 0.2 MG/ML IJ SOLN
INTRAMUSCULAR | Status: AC
  Filled 2021-06-27: qty 1

## 2021-06-27 MED ORDER — PENICILLIN G POTASSIUM 5000000 UNITS IJ SOLR
5.0000 10*6.[IU] | INTRAMUSCULAR | Status: DC
  Filled 2021-06-27 (×2): qty 5

## 2021-06-27 MED ORDER — FENTANYL-BUPIVACAINE-NACL 0.5-0.125-0.9 MG/250ML-% EP SOLN: 12.0000 mL/h | EPIDURAL | Status: DC | PRN

## 2021-06-27 MED ORDER — NIFEDIPINE 10 MG PO CAPS: 10.0000 mg | ORAL_CAPSULE | ORAL | Status: DC | PRN

## 2021-06-27 MED ORDER — FENTANYL-BUPIVACAINE-NACL 0.5-0.125-0.9 MG/250ML-% EP SOLN
EPIDURAL | Status: AC
  Filled 2021-06-27: qty 250

## 2021-06-27 MED ORDER — SENNOSIDES-DOCUSATE SODIUM 8.6-50 MG PO TABS: 2.0000 | ORAL_TABLET | ORAL | Status: DC

## 2021-06-27 MED ORDER — ZOLPIDEM TARTRATE 5 MG PO TABS: 5.0000 mg | ORAL_TABLET | Freq: Every evening | ORAL | Status: DC | PRN

## 2021-06-27 MED ORDER — MORPHINE SULFATE (PF) 10 MG/ML IV SOLN
10.0000 mg | Freq: Once | INTRAVENOUS | Status: AC | PRN
  Administered 2021-06-27: 10 mg via INTRAMUSCULAR
  Filled 2021-06-27: qty 1

## 2021-06-27 MED ORDER — SIMETHICONE 80 MG PO CHEW: 80.0000 mg | CHEWABLE_TABLET | ORAL | Status: DC | PRN

## 2021-06-27 MED ORDER — EPHEDRINE 5 MG/ML INJ: 10.0000 mg | INTRAVENOUS | Status: DC | PRN

## 2021-06-27 MED ORDER — PENICILLIN G POTASSIUM 5000000 UNITS IJ SOLR: 5.0000 10*6.[IU] | INTRAMUSCULAR | Status: DC

## 2021-06-27 MED ORDER — MISOPROSTOL 200 MCG PO TABS
ORAL_TABLET | ORAL | Status: AC
  Filled 2021-06-27: qty 5

## 2021-06-27 MED ORDER — ACETAMINOPHEN 325 MG PO TABS
650.0000 mg | ORAL_TABLET | ORAL | Status: DC | PRN
  Administered 2021-06-27: 650 mg via ORAL
  Filled 2021-06-27: qty 2

## 2021-06-27 MED ORDER — WITCH HAZEL-GLYCERIN EX PADS: 1.0000 "application " | MEDICATED_PAD | CUTANEOUS | Status: DC | PRN

## 2021-06-27 MED ORDER — SODIUM CHLORIDE 0.9% FLUSH: 3.0000 mL | INTRAVENOUS | Status: DC | PRN

## 2021-06-27 MED ORDER — OXYTOCIN-SODIUM CHLORIDE 30-0.9 UT/500ML-% IV SOLN
INTRAVENOUS | Status: AC
  Filled 2021-06-27: qty 500

## 2021-06-27 MED ORDER — LABETALOL HCL 5 MG/ML IV SOLN: 40.0000 mg | INTRAVENOUS | Status: DC | PRN

## 2021-06-27 MED ORDER — BUTORPHANOL TARTRATE 1 MG/ML IJ SOLN: 1.0000 mg | INTRAMUSCULAR | Status: DC | PRN

## 2021-06-27 MED ORDER — DIBUCAINE (PERIANAL) 1 % EX OINT: 1.0000 "application " | TOPICAL_OINTMENT | CUTANEOUS | Status: DC | PRN

## 2021-06-27 MED ORDER — CARBOPROST TROMETHAMINE 250 MCG/ML IM SOLN
INTRAMUSCULAR | Status: AC
  Filled 2021-06-27: qty 1

## 2021-06-27 MED ORDER — LACTATED RINGERS IV SOLN: 500.0000 mL | Freq: Once | INTRAVENOUS | Status: AC

## 2021-06-27 MED ORDER — LIDOCAINE-EPINEPHRINE (PF) 1.5 %-1:200000 IJ SOLN
INTRAMUSCULAR | Status: DC | PRN
  Administered 2021-06-27: 3 mL via EPIDURAL

## 2021-06-27 MED ORDER — FENTANYL-BUPIVACAINE-NACL 0.5-0.125-0.9 MG/250ML-% EP SOLN
EPIDURAL | Status: DC | PRN
  Administered 2021-06-27: 12 mL/h via EPIDURAL

## 2021-06-27 MED ORDER — OXYCODONE-ACETAMINOPHEN 5-325 MG PO TABS: 2.0000 | ORAL_TABLET | ORAL | Status: DC | PRN

## 2021-06-27 MED ORDER — SODIUM CHLORIDE 0.9 % IV SOLN
500.0000 mg | Freq: Once | INTRAVENOUS | Status: AC
  Administered 2021-06-27: 500 mg via INTRAVENOUS
  Filled 2021-06-27: qty 5

## 2021-06-27 MED ORDER — SODIUM CHLORIDE 0.9 % IV SOLN: 250.0000 mL | INTRAVENOUS | Status: DC | PRN

## 2021-06-27 MED ORDER — METHYLERGONOVINE MALEATE 0.2 MG/ML IJ SOLN
0.2000 mg | INTRAMUSCULAR | Status: DC | PRN
  Filled 2021-06-27: qty 1

## 2021-06-27 MED ORDER — BENZOCAINE-MENTHOL 20-0.5 % EX AERO: 1.0000 "application " | INHALATION_SPRAY | CUTANEOUS | Status: DC | PRN

## 2021-06-27 MED ORDER — OXYTOCIN BOLUS FROM INFUSION
333.0000 mL | Freq: Once | INTRAVENOUS | Status: AC
  Administered 2021-06-27: 333 mL via INTRAVENOUS

## 2021-06-27 MED ORDER — BETAMETHASONE SOD PHOS & ACET 6 (3-3) MG/ML IJ SUSP
12.0000 mg | INTRAMUSCULAR | Status: DC
  Administered 2021-06-27: 12 mg via INTRAMUSCULAR

## 2021-06-27 MED ORDER — ONDANSETRON HCL 4 MG/2ML IJ SOLN: 4.0000 mg | Freq: Four times a day (QID) | INTRAMUSCULAR | Status: DC | PRN

## 2021-06-27 MED ORDER — MISOPROSTOL 200 MCG PO TABS
ORAL_TABLET | ORAL | Status: AC
  Filled 2021-06-27: qty 4

## 2021-06-27 MED ORDER — MAGNESIUM SULFATE BOLUS VIA INFUSION
5.0000 g | Freq: Once | INTRAVENOUS | Status: AC
  Administered 2021-06-27: 5 g via INTRAVENOUS
  Filled 2021-06-27: qty 1000

## 2021-06-27 MED ORDER — ONDANSETRON HCL 4 MG PO TABS: 4.0000 mg | ORAL_TABLET | ORAL | Status: DC | PRN

## 2021-06-27 MED ORDER — ACETAMINOPHEN 325 MG PO TABS
650.0000 mg | ORAL_TABLET | ORAL | Status: DC | PRN
  Filled 2021-06-27: qty 2

## 2021-06-27 MED ORDER — PENICILLIN G POTASSIUM 5000000 UNITS IJ SOLR
5.0000 10*6.[IU] | INTRAMUSCULAR | Status: DC
  Filled 2021-06-27: qty 5

## 2021-06-27 MED ORDER — SODIUM CHLORIDE 0.9% FLUSH: 3.0000 mL | Freq: Two times a day (BID) | INTRAVENOUS | Status: DC

## 2021-06-27 MED ORDER — METHYLERGONOVINE MALEATE 0.2 MG PO TABS: 0.2000 mg | ORAL_TABLET | ORAL | Status: DC | PRN

## 2021-06-27 MED ORDER — BETAMETHASONE SOD PHOS & ACET 6 (3-3) MG/ML IJ SUSP
INTRAMUSCULAR | Status: AC
Start: 2021-06-27 — End: 2021-06-27
  Filled 2021-06-27: qty 5

## 2021-06-27 MED ORDER — LACTATED RINGERS IV BOLUS
500.0000 mL | Freq: Once | INTRAVENOUS | Status: AC
  Administered 2021-06-27: 500 mL via INTRAVENOUS

## 2021-06-27 MED ORDER — OXYCODONE-ACETAMINOPHEN 5-325 MG PO TABS: 1.0000 | ORAL_TABLET | ORAL | Status: DC | PRN

## 2021-06-27 MED ORDER — ONDANSETRON HCL 4 MG/2ML IJ SOLN: 4.0000 mg | INTRAMUSCULAR | Status: DC | PRN

## 2021-06-27 MED ORDER — BUTORPHANOL TARTRATE 1 MG/ML IJ SOLN: 1.0000 mg | Freq: Once | INTRAMUSCULAR | Status: DC

## 2021-06-27 MED ORDER — LIDOCAINE HCL (PF) 1 % IJ SOLN: 30.0000 mL | INTRAMUSCULAR | Status: DC | PRN

## 2021-06-27 MED ORDER — OXYTOCIN 10 UNIT/ML IJ SOLN
INTRAMUSCULAR | Status: DC
Start: 2021-06-27 — End: 2021-06-27
  Filled 2021-06-27: qty 2

## 2021-06-27 MED ORDER — LACTATED RINGERS IV SOLN
500.0000 mL | INTRAVENOUS | Status: DC | PRN
  Administered 2021-06-27: 500 mL via INTRAVENOUS

## 2021-06-27 MED ORDER — BUPIVACAINE HCL (PF) 0.25 % IJ SOLN
INTRAMUSCULAR | Status: DC | PRN
  Administered 2021-06-27: 3 mL via EPIDURAL
  Administered 2021-06-27: 4 mL via EPIDURAL

## 2021-06-27 MED ORDER — ACETAMINOPHEN 325 MG PO TABS: 650.0000 mg | ORAL_TABLET | ORAL | Status: DC | PRN

## 2021-06-27 NOTE — Progress Notes (Signed)
Pt discharged home.  Infant transferred to [facility] NICU. Discharge instructions, prescriptions and follow up appointment given to and reviewed with pt. Pt verbalized understanding. Escorted out by auxillary.

## 2021-06-27 NOTE — Discharge Summary (Deleted)
Physician Discharge Summary  Patient ID: Lauren Taylor MRN: [id number] DOB/AGE: 08/14/1995 25 y.o.  Admit date: 06/27/2021 Discharge date: 06/27/2021  Admission Diagnoses: Lower abdominal pain, pregnancy Preterm Premature Rupture of Membranes 27 weeks  Discharge Diagnoses:  Principal Problem:   Pregnancy related bilateral lower abdominal pain, antepartum   PPROM   27 weeks  Discharged Condition: [place] of care in stable condition to [facility] due to need/availability of NICU  Hospital Course: Pt seen and examined, noted to have some bleeding when wiping and lower abdominal cramping; then also reported leakage of watery type fluid per vagina.  It was noted on exam to be blood tinged and had ferning.  Baby Vertex and and with reassuring fetal heart tones.  Cervix not dilated.   Discussed care with Dr Baltazar Apo at [facility], due to need for transfer.  Accepting.  Consults: None  Significant Diagnostic Studies: A NST procedure was performed with FHR monitoring and a normal baseline established, appropriate time of 20-40 minutes of evaluation, and accels >2 seen w 15x15 characteristics.  Results show a REACTIVE NST.   Treatments: Betamethasone Antibiotics Magnesium  Discharge Exam: Blood pressure 122/73, pulse (!) 115, temperature 99.6 F (37.6 C), temperature source Oral, resp. rate 18, height 5\' 3"  (1.6 m), weight 68.5 kg. Unchanged  Disposition:  There are no questions and answers to display.      Signed: 06/27/2021, 3:44 AM   Addendum: Pt received Mag Bolus for neuroprotection prior to transfer Exam prior to transfer (now) is 2/70/-3, Vtx  06/29/2021, MD, Lauren Taylor Ob/Gyn, [facility] 06/27/2021  5:06 AM

## 2021-06-27 NOTE — OB Triage Note (Signed)
Pt is a G2P1 at [redacted]w[redacted]d presenting to L&D triage c/o cramping and vaginal bleeding. Pt is rating her pain 6/10. Pt states she had sexual intercourse 2 days ago and the cramping began yesterday. Pt states vaginal bleeding began an hour ago and was only when she wiped. Pt states when she used the restroom in Obs4 there was no vaginal bleeding. Pt endorses getting her cervix checked 06/26/21. VSS. +FM. Monitors applied and assessing.

## 2021-06-27 NOTE — H&P (Signed)
Obstetrics Admission History & Physical   Abdominal Cramping   HPI:  26 y.o. G2P1001 @ [redacted]w[redacted]d (09/25/2021, by Other Basis). Admitted on 06/27/2021:   Patient Active Problem List   Diagnosis Date Noted   Pregnancy related bilateral lower abdominal pain, antepartum 06/27/2021   Nausea and vomiting during pregnancy 06/24/2021   [redacted] weeks gestation of pregnancy    Obesity, unspecified 07/16/2017   Attention deficit hyperactivity disorder 01/27/2016   Acute depression 07/18/2014    Presents for vaginal bleeding, noted recently as occurring over the last 2 hours, noted when wiping only; no recent trauma; had sex 2 days ago; cervix checked in office yesterday.  Has had some lower abdominal pains that come and go, like contractions.  She has prior NSVD 6 yrs ago.  PNC at [facility], no other risk factors.  ROS: A review of systems was performed and negative, except as stated in the above HPI. PMHx:  Past Medical History:  Diagnosis Date   Migraine    PSHx: History reviewed. No pertinent surgical history. Medications:  Medications Prior to Admission  Medication Sig Dispense Refill Last Dose   promethazine (PHENERGAN) 25 MG tablet Take 0.5 tablets (12.5 mg total) by mouth every 6 (six) hours as needed for nausea or vomiting. 15 tablet 0 06/26/2021   Allergies: has No Known Allergies. OBHx:  OB History  Gravida Para Term Preterm AB Living  2 1 1  0 0 1  SAB IAB Ectopic Multiple Live Births  0 0 0 0 0    # Outcome Date GA Lbr Len/2nd Weight Sex Delivery Anes PTL Lv  2 Current           1 Term            except as detailed in HPI.WER:[id number]  No family history of birth defects. Soc Hx: Alcohol: none and Recreational drug use: none  Objective:   Vitals:   06/27/21 0018  BP: 122/73  Pulse: (!) 115  Resp: 18  Temp: 99.6 F (37.6 C)   Constitutional: Well nourished, well developed female in no acute distress.  HEENT: normal Skin: Warm and dry.   Cardiovascular:Regular rate and rhythm.   Extremity: trace to 1+ bilateral pedal edema Respiratory: Clear to auscultation bilateral. Normal respiratory effort Abdomen: gravid, ND, FHT present, mild tenderness on exam Back: no CVAT Neuro: DTRs 2+, Cranial nerves grossly intact Psych: Alert and Oriented x3. No memory deficits. Normal mood and affect.  MS: normal gait, normal bilateral lower extremity ROM/strength/stability.  Pelvic exam: is not limited by body habitus EGBUS: within normal limits Vagina: within normal limits and with normal mucosa Cervix: closed, soft, min effacement, high station, Vtx, blood noted on glove  A NST procedure was performed with FHR monitoring and a normal baseline established, appropriate time of 20-40 minutes of evaluation, and accels >2 seen w 15x15 characteristics.  Results show a REACTIVE NST.   Prior 06/29/21 04/2021, ant placenta, no concerning findings  Assessment & Plan:   26 y.o. G2P1001 @ [redacted]w[redacted]d, Admitted on 06/27/2021:Vaginal bleeding and mild pains  fFN to help assess for PTL.  Cervix not dilated.  She has had exam and intercourse these past 2 days that may contribute to this etiology.  Will monitor for any worsening signs of bleeding.  Tylenol or even Morphine for pain while assessing  UA  Bed rest   06/29/2021, MD, Annamarie Major Ob/Gyn, [facility] 06/27/2021  1:03 AM

## 2021-06-27 NOTE — Anesthesia Procedure Notes (Signed)
Epidural Patient location during procedure: OB Start time: 06/27/2021 6:58 AM End time: 06/27/2021 7:10 AM  Staffing Anesthesiologist: Foye Deer, MD Performed: anesthesiologist   Preanesthetic Checklist Completed: patient identified, IV checked, site marked, risks and benefits discussed, surgical consent, monitors and equipment checked, pre-op evaluation and timeout performed  Epidural Patient position: sitting Prep: ChloraPrep Patient monitoring: heart rate, continuous pulse ox and blood pressure Approach: midline Location: L3-L4 Injection technique: LOR saline  Needle:  Needle type: Tuohy  Needle gauge: 18 G Needle length: 9 cm Needle insertion depth: 6 cm Catheter type: closed end Catheter size: 20 Guage Catheter at skin depth: 10 cm Test dose: negative and 1.5% lidocaine with Epi 1:200 K  Assessment Events: blood not aspirated, injection not painful, no injection resistance and paresthesia  Additional Notes Reason for block:procedure for pain

## 2021-06-27 NOTE — Discharge Summary (Signed)
Postpartum Discharge Summary  Date of Service updated2/24/23     Patient Name: Lauren Taylor DOB: [birth year] MRN: [id number]  Date of admission: 06/27/2021 Delivery date:06/27/2021  Delivering provider: Gae Dry  Date of discharge: 06/27/2021  Admitting diagnosis: Pregnancy related bilateral lower abdominal pain, antepartum [O26.899, R10.30] Preterm premature rupture of membranes (PPROM) delivered, current hospitalization [O42.919] Intrauterine pregnancy: [redacted]w[redacted]d    Secondary diagnosis:  Principal Problem:   Preterm premature rupture of membranes (PPROM) delivered, current hospitalization Active Problems:   Pregnancy related bilateral lower abdominal pain, antepartum  Additional problems: PPROM, PTL, PTD    Discharge diagnosis: Preterm Pregnancy Delivered and Possible abruption                                               Post partum procedures: none Augmentation:  none Complications: None  Hospital course: Onset of Labor With Vaginal Delivery      26y.o. yo [id number] admitted w PPROM and with bleeding and in Active Labor on 06/27/2021. Patient had an uncomplicated labor course as follows:  Membrane Rupture Time/Date: 4:20 AM ,06/27/2021   Delivery Method:Vaginal, Spontaneous  Episiotomy: None  Lacerations:  None  Patient had an uncomplicated postpartum course.  She is ambulating, tolerating a regular diet, passing flatus, and urinating well. Patient is discharged home in stable condition on 06/27/21.  Newborn Data: Birth date:06/27/2021  Birth time:8:02 AM  Gender:Female  Living status:Living  Apgars:8 ,8  Weight:870 g   Magnesium Sulfate received: Yes: Neuroprotection BMZ received: Yes Rhophylac:No MMR:No T-DaP: unknown last time of administatrion Flu: No Transfusion:No  Physical exam  Vitals:   06/27/21 0902 06/27/21 0951 06/27/21 1334 06/27/21 1503  BP: 105/65 105/68 112/67 116/77  Pulse: 100 (!) 106 87 77  Resp:   18 18  Temp:   (!)  97.5 F (36.4 C) 97.6 F (36.4 C)  TempSrc:   Oral Oral  SpO2:   100% 100%  Weight:      Height:       General: alert, cooperative, and no distress Lochia: appropriate Uterine Fundus: firm Incision: N/A DVT Evaluation: No evidence of DVT seen on physical exam. Labs: Lab Results  Component Value Date   WBC 10.5 06/27/2021   HGB 10.0 (L) 06/27/2021   HCT 29.3 (L) 06/27/2021   MCV 79.8 (L) 06/27/2021   PLT 145 (L) 06/27/2021   CMP Latest Ref Rng & Units 06/24/2021  Glucose 70 - 99 mg/dL 105(H)  BUN 6 - 20 mg/dL 8  Creatinine 0.44 - 1.00 mg/dL 0.57  Sodium 135 - 145 mmol/L 137  Potassium 3.5 - 5.1 mmol/L 3.0(L)  Chloride 98 - 111 mmol/L 107  CO2 22 - 32 mmol/L 20(L)  Calcium 8.9 - 10.3 mg/dL 8.2(L)  Total Protein 6.5 - 8.1 g/dL 6.9  Total Bilirubin 0.3 - 1.2 mg/dL 0.5  Alkaline Phos 38 - 126 U/L 70  AST 15 - 41 U/L 38  ALT 0 - 44 U/L 27   Edinburgh Score: Edinburgh Postnatal Depression Scale Screening Tool 06/27/2021  I have been able to laugh and see the funny side of things. 0  I have looked forward with enjoyment to things. 1  I have blamed myself unnecessarily when things went wrong. 2  I have been anxious or worried for no good reason. 2  I have felt scared or  panicky for no good reason. 2  Things have been getting on top of me. 1  I have been so unhappy that I have had difficulty sleeping. 2  I have felt sad or miserable. 1  I have been so unhappy that I have been crying. 2  The thought of harming myself has occurred to me. 0  Edinburgh Postnatal Depression Scale Total 13      After visit meds:  Allergies as of 06/27/2021   No Known Allergies      Medication List     STOP taking these medications    promethazine 25 MG tablet Commonly known as: PHENERGAN         Discharge home in stable condition Infant Feeding:  unsure (premature at [facility] currently) Infant Disposition:NICU Discharge instruction: per After Visit Summary and Postpartum  booklet. Activity: Advance as tolerated. Pelvic rest for 6 weeks.  Diet: routine diet Anticipated Birth Control: Unsure Postpartum Appointment:6 weeks Additional Postpartum F/U:  none Future Appointments:No future appointments. Follow up Visit:      06/27/2021 Hoyt Koch, MD

## 2021-06-27 NOTE — Anesthesia Preprocedure Evaluation (Signed)
Anesthesia Evaluation  Patient identified by MRN, date of birth, ID band Patient awake    Reviewed: Allergy & Precautions, NPO status , Patient's Chart, lab work & pertinent test results  Airway Mallampati: III  TM Distance: >3 FB Neck ROM: full    Dental no notable dental hx.    Pulmonary neg pulmonary ROS,    Pulmonary exam normal        Cardiovascular Exercise Tolerance: Good negative cardio ROS Normal cardiovascular exam     Neuro/Psych  Headaches, PSYCHIATRIC DISORDERS Depression    GI/Hepatic negative GI ROS,   Endo/Other    Renal/GU   negative genitourinary   Musculoskeletal   Abdominal   Peds  Hematology  (+) Blood dyscrasia (thrombocytopenia), ,   Anesthesia Other Findings 27 weeks of pregnancy  Past Medical History: No date: Migraine  History reviewed. No pertinent surgical history.  BMI    Body Mass Index: 26.75 kg/m      Reproductive/Obstetrics (+) Pregnancy                             Anesthesia Physical Anesthesia Plan  ASA: 2  Anesthesia Plan: Epidural   Post-op Pain Management:    Induction:   PONV Risk Score and Plan:   Airway Management Planned:   Additional Equipment:   Intra-op Plan:   Post-operative Plan:   Informed Consent: I have reviewed the patients History and Physical, chart, labs and discussed the procedure including the risks, benefits and alternatives for the proposed anesthesia with the patient or authorized representative who has indicated his/her understanding and acceptance.       Plan Discussed with: Anesthesiologist  Anesthesia Plan Comments:         Anesthesia Quick Evaluation

## 2021-06-27 NOTE — Progress Notes (Signed)
°  Labor Progress Note   26 y.o. G2P1001 @ [redacted]w[redacted]d , admitted for management of preterm labor w PPROM, as she has shown progress of labor despite magnesium. There were plans for transfer to [facility] due to early gest age and need for NICU upon delivery, but as she has rapidly progressed, she needs to stay here as cannot ensure safety in transport.  Subjective:  Pain w ctxs Bloody show and LOF with ctxs  Objective:  BP 107/63    Pulse (!) 122    Temp 98.6 F (37 C) (Oral)    Resp 20    Ht 5\' 3"  (1.6 m)    Wt 68.5 kg    BMI 26.75 kg/m  Abd: gravid, ND, FHT present, moderate tenderness on exam Extr: trace to 1+ bilateral pedal edema SVE: 9/100/-1 VTX A NST procedure was performed with FHR monitoring and a normal baseline established, appropriate time of 20-40 minutes of evaluation, and accels >2 seen w 15x15 characteristics.  Results show a REACTIVE NST.   Assessment & Plan:  G2P1001 @ [redacted]w[redacted]d, admitted for PPROM and PTL Pregnancy and Labor/Delivery Management BMZ one dose PCN and Azithromycin Magnesium  1. Pain management:  Nitrous . 2. FWB: FHT category 1.  3. ID: GBS  unknown 4. Labor management: Likely delivery soon Neonatal consulted to discuss expectations w pt  All discussed with patient, see orders  [redacted]w[redacted]d, MD, Annamarie Major Ob/Gyn, [facility] 06/27/2021  6:31 AM

## 2021-06-27 NOTE — Progress Notes (Signed)
Pt reports fluid leakage over the last hour Exam confirms copious amount of pooling of blood tinged fluid Leonides Grills FHT still reassuring  Concern now for PPROM at 27 1/[redacted] weeks EGA  Steroids and ABX given Discussed risks of prematurity w pt Discussed level of NICU availability that would be safest and best for neonate once delivered, especially if <30 weeks should be not at [facility] but at an institution w NICU   Plan transfer, d/w MD at [facility] (Dr Roland Rack)  Barnett Applebaum, MD, [place], [place] 06/27/2021  3:40 AM

## 2021-06-27 NOTE — Lactation Note (Addendum)
Mom has decided she would like to pump her breasts to provide EBM to baby at [facility], she was unsure at first, she had planned on formula feeding, she is being discharged to home in a few minutes, she is not on WIC at present and does not have a breast pump for home use, I faxed a WIC referral to [facility]IC [place] [facility], mom instructed to call WIC on Monday  to obtain an electric pump and sign up for WIC.  I gave her a Medela breast pump kit to use at [facility] as she is going there after discharge.  I showed her how to use the [employer] manual breast pump, to use at home until she can obtain an electric pump, she can use the electric  breast pump at [facility] at the baby's bedside.  She had no questions at this time.   °

## 2021-06-30 LAB — SURGICAL PATHOLOGY

## 2021-07-04 NOTE — Anesthesia Postprocedure Evaluation (Signed)
Anesthesia Post Note ? ?Patient: Lauren Taylor ? ?Procedure(s) Performed: AN AD HOC LABOR EPIDURAL ? ?Patient location during evaluation: Other (Pt discharged day of epidural with no omplaints based off chart review) ?Anesthesia Type: Epidural ?Comments: Pt was discharged prior to epidural rounds and was not rounded on. ? ? ?No notable events documented. ? ? ?Last Vitals: There were no vitals filed for this visit.  ?Last Pain: There were no vitals filed for this visit. ? ?  ?  ?  ?  ?  ?  ? ?Foye Deer ? ? ? ? ? ?

## 2022-07-26 ENCOUNTER — Emergency Department: Payer: Medicaid Other

## 2022-07-26 ENCOUNTER — Emergency Department
Admission: EM | Admit: 2022-07-26 | Discharge: 2022-07-26 | Disposition: A | Discharge status: Home / Self Care | Payer: Medicaid Other | Attending: Emergency Medicine | Admitting: Emergency Medicine

## 2022-07-26 ENCOUNTER — Other Ambulatory Visit: Payer: Self-pay

## 2022-07-26 DIAGNOSIS — S42022A Displaced fracture of shaft of left clavicle, initial encounter for closed fracture: Secondary | ICD-10-CM | POA: Diagnosis not present

## 2022-07-26 DIAGNOSIS — R000 Tachycardia, unspecified: Secondary | ICD-10-CM | POA: Insufficient documentation

## 2022-07-26 DIAGNOSIS — W19XXXA Unspecified fall, initial encounter: Secondary | ICD-10-CM

## 2022-07-26 DIAGNOSIS — R519 Headache, unspecified: Secondary | ICD-10-CM | POA: Insufficient documentation

## 2022-07-26 DIAGNOSIS — S0990XA Unspecified injury of head, initial encounter: Secondary | ICD-10-CM

## 2022-07-26 DIAGNOSIS — W01198A Fall on same level from slipping, tripping and stumbling with subsequent striking against other object, initial encounter: Secondary | ICD-10-CM | POA: Insufficient documentation

## 2022-07-26 DIAGNOSIS — S4992XA Unspecified injury of left shoulder and upper arm, initial encounter: Secondary | ICD-10-CM | POA: Diagnosis present

## 2022-07-26 DIAGNOSIS — I10 Essential (primary) hypertension: Secondary | ICD-10-CM | POA: Diagnosis not present

## 2022-07-26 MED ORDER — KETOROLAC TROMETHAMINE 15 MG/ML IJ SOLN
15.0000 mg | Freq: Once | INTRAMUSCULAR | Status: AC
  Administered 2022-07-26: 15 mg via INTRAMUSCULAR
  Filled 2022-07-26: qty 1

## 2022-07-26 MED ORDER — OXYCODONE HCL 5 MG PO TABS: 5.0000 mg | ORAL_TABLET | Freq: Three times a day (TID) | ORAL | 0 refills | Status: DC | PRN

## 2022-07-26 MED ORDER — OXYCODONE-ACETAMINOPHEN 5-325 MG PO TABS
1.0000 | ORAL_TABLET | Freq: Once | ORAL | Status: AC
  Administered 2022-07-26: 1 via ORAL
  Filled 2022-07-26: qty 1

## 2022-07-26 MED ORDER — OXYCODONE HCL 5 MG PO TABS: 5.0000 mg | ORAL_TABLET | Freq: Three times a day (TID) | ORAL | 0 refills | Status: AC | PRN

## 2022-07-26 NOTE — ED Notes (Signed)
Pt verbalizes understanding of discharge instructions. Opportunity for questioning and answers were provided. Pt discharged from ED to home with significant other.   ? ?

## 2022-07-26 NOTE — Discharge Instructions (Addendum)
Please keep the sling on your left arm and to see the orthopedic doctors.  Take Tylenol Motrin for pain.  You can take the oxycodone for breakthrough pain.

## 2022-07-26 NOTE — ED Provider Notes (Signed)
[facility] Provider Note    Event Date/Time   First MD Initiated Contact with Patient 07/26/22 1249     (approximate)   History   Fall   HPI  Kathrine Krammes is a 27 y.o. female past medical history of migraines 2 weeks postpartum who presents after a fall.  Patient was walking in Her house when she fell.  She cannot tell me why she fell or if she tripped but her partner who was in the other room heard her screaming the whole time so he knows she did not lose consciousness.  Patient hit the left side of her head and her left collarbone.  Has had significant pain over the clavicle since.  Also has mild headache denies vision change numbness tingling weakness denies chest or abdominal pain she is not on blood thinners     Past Medical History:  Diagnosis Date   Migraine     Patient Active Problem List   Diagnosis Date Noted   Pregnancy related bilateral lower abdominal pain, antepartum 06/27/2021   Preterm premature rupture of membranes (PPROM) delivered, current hospitalization 06/27/2021   Nausea and vomiting during pregnancy 06/24/2021   [redacted] weeks gestation of pregnancy    Obesity, unspecified 07/16/2017   Attention deficit hyperactivity disorder 01/27/2016   Acute depression 07/18/2014     Physical Exam  Triage Vital Signs: ED Triage Vitals [07/26/22 1238]  Enc Vitals Group     BP (!) 144/95     Pulse Rate (!) 108     Resp 17     Temp 98.7 F (37.1 C)     Temp src      SpO2 96 %     Weight 150 lb (68 kg)     Height 5\' 3"  (1.6 m)     Head Circumference      Peak Flow      Pain Score 7     Pain Loc      Pain Edu?      Excl. in [place]?     Most recent vital signs: Vitals:   07/26/22 1300 07/26/22 1430  BP: (!) 126/99 (!) 147/91  Pulse: 87 90  Resp: 18 18  Temp:    SpO2: 98% 97%     General: Awake, no distress.  CV:  Good peripheral perfusion.  Resp:  Normal effort.  Abd:  No distention.  Neuro:             Awake, Alert,  Oriented x 3  Other:  Aox3, nml speech  PERRL, EOMI, face symmetric, nml tongue movement  5/5 strength in the BL upper and lower extremities  Sensation grossly intact in the BL upper and lower extremities  Finger-nose-finger intact BL  Swelling over the left parietal region without laceration Tenderness to palpation over the mid to distal clavicle with palpable crepitus, creased range of motion of shoulder 2+ radial pulse, intact okay sign finger abduction and thumbs up   ED Results / Procedures / Treatments  Labs (all labs ordered are listed, but only abnormal results are displayed) Labs Reviewed - No data to display   EKG     RADIOLOGY I reviewed and interpreted the CT scan of the brain which does not show any acute intracranial process    PROCEDURES:  Critical Care performed: No  .1-3 Lead EKG Interpretation  Performed by: Rada Hay, MD Authorized by: Rada Hay, MD     Interpretation: normal     ECG  rate assessment: normal     Rhythm: sinus rhythm     Ectopy: none     Conduction: normal     The patient is on the cardiac monitor to evaluate for evidence of arrhythmia and/or significant heart rate changes.   MEDICATIONS ORDERED IN ED: Medications  oxyCODONE-acetaminophen (PERCOCET/ROXICET) 5-325 MG per tablet 1 tablet (1 tablet Oral Given 07/26/22 1304)  ketorolac (TORADOL) 15 MG/ML injection 15 mg (15 mg Intramuscular Given 07/26/22 1429)     IMPRESSION / MDM / ASSESSMENT AND PLAN / ED COURSE  I reviewed the triage vital signs and the nursing notes.                              Patient's presentation is most consistent with acute complicated illness / injury requiring diagnostic workup.  Differential diagnosis includes, but is not limited to, clavicle fracture, intracranial hemorrhage, concussion, AC joint separation The patient is a 27 year old female who presents after fall.  Somewhat unclear about the nature of the fall she was walking  the house not clearly syncopized as she did not feel lightheaded and she was conscious the whole time.  Did hit her head.  Her main complaint is pain over the collarbone.  Patient mildly tachycardic and hypertensive on arrival.  My evaluation she is alert and oriented she does have point tenderness over the mid to distal clavicle with palpable crepitus and decreased range of motion but is neurovascular intact.  It is closed.  Suspect clavicle fracture.  Also has a large knot in the left side of the head send obtain a CT of the head and x-ray of clavicle will give Percocet for pain.  X-ray shows midshaft comminuted clavicle fracture.  Will place in sling.  Given it is comminuted and will give her orthopedic follow-up.  There is no skin tenting is not open no indication for urgent evaluation.  CT head is negative.  On repeat assessment patient continues to [facility] normally.  Will discharge        FINAL CLINICAL IMPRESSION(S) / ED DIAGNOSES   Final diagnoses:  Fall, initial encounter  Closed displaced fracture of shaft of left clavicle, initial encounter  Injury of head, initial encounter     Rx / DC Orders   ED Discharge Orders          Ordered    oxyCODONE (ROXICODONE) 5 MG immediate release tablet  Every 8 hours PRN        07/26/22 1449             Note:  This document was prepared using Dragon voice recognition software and may include unintentional dictation errors.   Rada Hay, MD 07/26/22 [phone]

## 2022-07-26 NOTE — ED Notes (Signed)
Pt answered all orientation questions appropriately for this RN. Pt does not remember if she tripped and fell or if she lost consciousness.

## 2022-07-26 NOTE — ED Triage Notes (Signed)
Pt states coming in after a fall this morning. Pt states she either hit her head on the counter or the wood floor. Pt did not have loss of consciousness. Pt reporting collar bone pain and difficulty remembering things. Pt unable to tell me what month we are in, but is able to tell me her name, DoB, city and location. Pt states feeling very tired.

## 2022-11-18 ENCOUNTER — Other Ambulatory Visit: Payer: Self-pay

## 2022-11-18 ENCOUNTER — Encounter: Payer: Self-pay | Admitting: Emergency Medicine

## 2022-11-18 ENCOUNTER — Emergency Department: Payer: Medicaid Other

## 2022-11-18 ENCOUNTER — Observation Stay
Admission: EM | Admit: 2022-11-18 | Discharge: 2022-11-18 | Disposition: A | Discharge status: Home / Self Care | Payer: Medicaid Other | Attending: Obstetrics and Gynecology | Admitting: Obstetrics and Gynecology

## 2022-11-18 ENCOUNTER — Encounter
Admission: EM | Disposition: A | Discharge status: Home / Self Care | Payer: Self-pay | Source: Home / Self Care | Attending: Emergency Medicine

## 2022-11-18 ENCOUNTER — Observation Stay: Payer: Medicaid Other | Admitting: Certified Registered"

## 2022-11-18 DIAGNOSIS — N920 Excessive and frequent menstruation with regular cycle: Secondary | ICD-10-CM | POA: Diagnosis not present

## 2022-11-18 DIAGNOSIS — O034 Incomplete spontaneous abortion without complication: Principal | ICD-10-CM

## 2022-11-18 DIAGNOSIS — N939 Abnormal uterine and vaginal bleeding, unspecified: Secondary | ICD-10-CM | POA: Diagnosis present

## 2022-11-18 HISTORY — PX: DILATION AND EVACUATION: SHX1459

## 2022-11-18 LAB — CBC WITH DIFFERENTIAL/PLATELET
Abs Immature Granulocytes: 0.02 10*3/uL (ref 0.00–0.07)
Basophils Absolute: 0 10*3/uL (ref 0.0–0.1)
Basophils Relative: 0 %
Eosinophils Absolute: 0.2 10*3/uL (ref 0.0–0.5)
Eosinophils Relative: 2 %
HCT: 33.3 % — ABNORMAL LOW (ref 36.0–46.0)
Hemoglobin: 10.9 g/dL — ABNORMAL LOW (ref 12.0–15.0)
Immature Granulocytes: 0 %
Lymphocytes Relative: 14 %
Lymphs Abs: 1.1 10*3/uL (ref 0.7–4.0)
MCH: 27.3 pg (ref 26.0–34.0)
MCHC: 32.7 g/dL (ref 30.0–36.0)
MCV: 83.3 fL (ref 80.0–100.0)
Monocytes Absolute: 0.4 10*3/uL (ref 0.1–1.0)
Monocytes Relative: 5 %
Neutro Abs: 6.4 10*3/uL (ref 1.7–7.7)
Neutrophils Relative %: 79 %
Platelets: 202 10*3/uL (ref 150–400)
RBC: 4 MIL/uL (ref 3.87–5.11)
RDW: 13.7 % (ref 11.5–15.5)
WBC: 8.2 10*3/uL (ref 4.0–10.5)
nRBC: 0 % (ref 0.0–0.2)

## 2022-11-18 LAB — URINALYSIS, ROUTINE W REFLEX MICROSCOPIC
Bilirubin Urine: NEGATIVE
Glucose, UA: NEGATIVE mg/dL
Ketones, ur: NEGATIVE mg/dL
Leukocytes,Ua: NEGATIVE
Nitrite: NEGATIVE
Protein, ur: 100 mg/dL — AB
RBC / HPF: >50 RBC/hpf (ref 0–5)
Specific Gravity, Urine: 1.023 (ref 1.005–1.030)
Squamous Epithelial / HPF: NONE SEEN /HPF (ref 0–5)
pH: 7 (ref 5.0–8.0)

## 2022-11-18 LAB — COMPREHENSIVE METABOLIC PANEL
ALT: 11 U/L (ref 0–44)
AST: 16 U/L (ref 15–41)
Albumin: 3.7 g/dL (ref 3.5–5.0)
Alkaline Phosphatase: 45 U/L (ref 38–126)
Anion gap: 6 (ref 5–15)
BUN: 7 mg/dL (ref 6–20)
CO2: 24 mmol/L (ref 22–32)
Calcium: 9.1 mg/dL (ref 8.9–10.3)
Chloride: 107 mmol/L (ref 98–111)
Creatinine, Ser: 0.65 mg/dL (ref 0.44–1.00)
GFR, Estimated: >60 mL/min (ref >60)
Glucose, Bld: 99 mg/dL (ref 70–99)
Potassium: 3.5 mmol/L (ref 3.5–5.1)
Sodium: 137 mmol/L (ref 135–145)
Total Bilirubin: 0.4 mg/dL (ref 0.3–1.2)
Total Protein: 7 g/dL (ref 6.5–8.1)

## 2022-11-18 LAB — HCG, QUANTITATIVE, PREGNANCY: hCG, Beta Chain, Quant, S: 1454 m[IU]/mL — ABNORMAL HIGH (ref <5)

## 2022-11-18 LAB — POC URINE PREG, ED: Preg Test, Ur: POSITIVE — AB

## 2022-11-18 SURGERY — DILATION AND EVACUATION, UTERUS
Anesthesia: General | Site: Vagina

## 2022-11-18 MED ORDER — ONDANSETRON HCL 4 MG/2ML IJ SOLN
INTRAMUSCULAR | Status: DC | PRN
  Administered 2022-11-18 (×2): 4 mg via INTRAVENOUS

## 2022-11-18 MED ORDER — KETAMINE HCL 50 MG/5ML IJ SOSY
PREFILLED_SYRINGE | INTRAMUSCULAR | Status: AC
  Filled 2022-11-18: qty 5

## 2022-11-18 MED ORDER — CHLORHEXIDINE GLUCONATE 0.12 % MT SOLN
OROMUCOSAL | Status: AC
  Filled 2022-11-18: qty 15

## 2022-11-18 MED ORDER — GLYCOPYRROLATE 0.2 MG/ML IJ SOLN
INTRAMUSCULAR | Status: DC | PRN
  Administered 2022-11-18: .2 mg via INTRAVENOUS

## 2022-11-18 MED ORDER — MIDAZOLAM HCL 2 MG/2ML IJ SOLN
INTRAMUSCULAR | Status: DC | PRN
  Administered 2022-11-18: 2 mg via INTRAVENOUS

## 2022-11-18 MED ORDER — PROPOFOL 10 MG/ML IV BOLUS
INTRAVENOUS | Status: DC | PRN
Start: 2022-11-18 — End: 2022-11-18
  Administered 2022-11-18: 20 mg via INTRAVENOUS
  Administered 2022-11-18: 30 mg via INTRAVENOUS

## 2022-11-18 MED ORDER — ACETAMINOPHEN 10 MG/ML IV SOLN
INTRAVENOUS | Status: AC
  Filled 2022-11-18: qty 100

## 2022-11-18 MED ORDER — DEXAMETHASONE SODIUM PHOSPHATE 10 MG/ML IJ SOLN
INTRAMUSCULAR | Status: DC | PRN
  Administered 2022-11-18: 10 mg via INTRAVENOUS

## 2022-11-18 MED ORDER — KETAMINE HCL 10 MG/ML IJ SOLN
INTRAMUSCULAR | Status: DC | PRN
  Administered 2022-11-18: 20 mg via INTRAVENOUS

## 2022-11-18 MED ORDER — LACTATED RINGERS IV SOLN: INTRAVENOUS | Status: DC

## 2022-11-18 MED ORDER — CHLORHEXIDINE GLUCONATE 0.12 % MT SOLN
15.0000 mL | Freq: Once | OROMUCOSAL | Status: AC
  Administered 2022-11-18: 15 mL via OROMUCOSAL

## 2022-11-18 MED ORDER — MIDAZOLAM HCL 2 MG/2ML IJ SOLN
INTRAMUSCULAR | Status: AC
  Filled 2022-11-18: qty 2

## 2022-11-18 MED ORDER — FENTANYL CITRATE (PF) 100 MCG/2ML IJ SOLN
INTRAMUSCULAR | Status: DC | PRN
  Administered 2022-11-18 (×3): 25 ug via INTRAVENOUS

## 2022-11-18 MED ORDER — DEXMEDETOMIDINE HCL IN NACL 200 MCG/50ML IV SOLN
INTRAVENOUS | Status: DC | PRN
  Administered 2022-11-18: 12 ug via INTRAVENOUS

## 2022-11-18 MED ORDER — METHYLERGONOVINE MALEATE 0.2 MG/ML IJ SOLN
0.2000 mg | Freq: Once | INTRAMUSCULAR | Status: AC
  Administered 2022-11-18: 0.2 mg via INTRAMUSCULAR
  Filled 2022-11-18: qty 1

## 2022-11-18 MED ORDER — DOXYCYCLINE HYCLATE 100 MG IV SOLR
200.0000 mg | INTRAVENOUS | Status: AC
  Administered 2022-11-18: 200 mg via INTRAVENOUS
  Filled 2022-11-18: qty 200

## 2022-11-18 MED ORDER — 0.9 % SODIUM CHLORIDE (POUR BTL) OPTIME
TOPICAL | Status: DC | PRN
  Administered 2022-11-18: 500 mL

## 2022-11-18 MED ORDER — SILVER NITRATE-POT NITRATE 75-25 % EX MISC
CUTANEOUS | Status: AC
  Filled 2022-11-18: qty 10

## 2022-11-18 MED ORDER — KETOROLAC TROMETHAMINE 30 MG/ML IJ SOLN
INTRAMUSCULAR | Status: DC | PRN
  Administered 2022-11-18: 30 mg via INTRAVENOUS

## 2022-11-18 MED ORDER — ORAL CARE MOUTH RINSE: 15.0000 mL | Freq: Once | OROMUCOSAL | Status: AC

## 2022-11-18 MED ORDER — FENTANYL CITRATE (PF) 100 MCG/2ML IJ SOLN
INTRAMUSCULAR | Status: AC
  Filled 2022-11-18: qty 2

## 2022-11-18 MED ORDER — PROPOFOL 500 MG/50ML IV EMUL
INTRAVENOUS | Status: DC | PRN
  Administered 2022-11-18: 165 ug/kg/min via INTRAVENOUS

## 2022-11-18 MED ORDER — FENTANYL CITRATE (PF) 100 MCG/2ML IJ SOLN: 25.0000 ug | INTRAMUSCULAR | Status: DC | PRN

## 2022-11-18 MED ORDER — ACETAMINOPHEN 10 MG/ML IV SOLN
INTRAVENOUS | Status: DC | PRN
  Administered 2022-11-18: 1000 mg via INTRAVENOUS

## 2022-11-18 MED ORDER — PROPOFOL 1000 MG/100ML IV EMUL
INTRAVENOUS | Status: AC
  Filled 2022-11-18: qty 100

## 2022-11-18 SURGICAL SUPPLY — 28 items
DRSG TELFA 3X8 NADH STRL (GAUZE/BANDAGES/DRESSINGS) IMPLANT
FILTER UTR ASPR ASSEMBLY (MISCELLANEOUS) ×1 IMPLANT
FILTER UTR ASPR SPEC (MISCELLANEOUS) ×1 IMPLANT
FLTR UTR ASPR SPEC (MISCELLANEOUS) ×1
GLOVE SURG SYN 8.0 (GLOVE) ×1 IMPLANT
GLOVE SURG SYN 8.0 PF PI (GLOVE) ×1 IMPLANT
GOWN STRL REUS W/ TWL LRG LVL3 (GOWN DISPOSABLE) ×1 IMPLANT
GOWN STRL REUS W/ TWL XL LVL3 (GOWN DISPOSABLE) ×1 IMPLANT
GOWN STRL REUS W/TWL LRG LVL3 (GOWN DISPOSABLE) ×1
GOWN STRL REUS W/TWL XL LVL3 (GOWN DISPOSABLE) ×1
KIT BERKELEY 1ST TRIMESTER 3/8 (MISCELLANEOUS) ×1 IMPLANT
KIT TURNOVER CYSTO (KITS) ×1 IMPLANT
MANIFOLD NEPTUNE II (INSTRUMENTS) ×1 IMPLANT
PACK DNC HYST (MISCELLANEOUS) ×1 IMPLANT
PAD OB MATERNITY 4.3X12.25 (PERSONAL CARE ITEMS) ×1 IMPLANT
PAD PREP OB/GYN DISP 24X41 (PERSONAL CARE ITEMS) ×1 IMPLANT
SCRUB CHG 4% DYNA-HEX 4OZ (MISCELLANEOUS) ×1 IMPLANT
SET BERKELEY SUCTION TUBING (SUCTIONS) ×1 IMPLANT
SET CYSTO W/LG BORE CLAMP LF (SET/KITS/TRAYS/PACK) IMPLANT
TOWEL OR 17X26 4PK STRL BLUE (TOWEL DISPOSABLE) ×1 IMPLANT
TRAP FLUID SMOKE EVACUATOR (MISCELLANEOUS) ×1 IMPLANT
TRAP TISSUE FILTER (MISCELLANEOUS) ×1 IMPLANT
VACURETTE 10 RIGID CVD (CANNULA) ×1 IMPLANT
VACURETTE 6 ASPIR F TIP BERK (CANNULA) ×1 IMPLANT
VACURETTE 7MM F TIP STRL (CANNULA) ×1 IMPLANT
VACURETTE 8 RIGID CVD (CANNULA) ×1 IMPLANT
VACURETTE 8MM F TIP (MISCELLANEOUS) ×1 IMPLANT
WATER STERILE IRR 500ML POUR (IV SOLUTION) ×1 IMPLANT

## 2022-11-18 NOTE — Transfer of Care (Addendum)
Immediate Anesthesia Transfer of Care Note  Patient: Lauren Taylor  Procedure(s) Performed: DILATATION AND EVACUATION (Vagina )  Patient Location: PACU  Anesthesia Type:General  Level of Consciousness: drowsy and patient cooperative  Airway & Oxygen Therapy: Patient Spontanous Breathing and Patient connected to face mask oxygen  Post-op Assessment: Report given to RN and Post -op Vital signs reviewed and stable  Post vital signs: Reviewed and stable  Last Vitals:  Vitals Value Taken Time  BP 82/48 11/18/22 1245  Temp    Pulse 70 11/18/22 1248  Resp 22 11/18/22 1248  SpO2 91 % 11/18/22 1248  Vitals shown include unfiled device data.  Last Pain:  Vitals:   11/18/22 1126  TempSrc: Oral  PainSc: 0-No pain         Complications: No notable events documented.

## 2022-11-18 NOTE — ED Provider Notes (Signed)
[facility] Provider Note    Event Date/Time   First MD Initiated Contact with Patient 11/18/22 0308     (approximate)   History   Vaginal Bleeding   HPI Lauren Taylor is a 27 y.o. female G4, P3 AB 1 who presents for heavy vaginal bleeding and cramping.  She had an elective abortion at [facility] about 4 days ago.  She had some vaginal bleeding afterwards but it got better.  However tonight it started again very heavy including the passage of large clots.  She got a little bit lightheaded and dizzy.  She said she was going through a pad about every 20 minutes.  It has slowed down since then but she is still bleeding heavily.  She has been having intermittent abdominal cramping but the pain is not severe.  No nausea nor vomiting.  No recent fever.     Physical Exam   Triage Vital Signs: ED Triage Vitals  Encounter Vitals Group     BP 11/18/22 0045 133/86     Systolic BP Percentile --      Diastolic BP Percentile --      Pulse Rate 11/18/22 0045 (!) 103     Resp 11/18/22 0045 18     Temp 11/18/22 0045 99.8 F (37.7 C)     Temp src --      SpO2 11/18/22 0045 99 %     Weight 11/18/22 0045 70.3 kg (155 lb)     Height 11/18/22 0045 1.6 m (5\' 3" )     Head Circumference --      Peak Flow --      Pain Score 11/18/22 0045 6     Pain Loc --      Pain Education --      Exclude from Growth Chart --     Most recent vital signs: Vitals:   11/18/22 0045 11/18/22 0549  BP: 133/86 120/69  Pulse: (!) 103 96  Resp: 18 20  Temp: 99.8 F (37.7 C) 99.1 F (37.3 C)  SpO2: 99% 100%    General: Awake, no distress.  Generally well-appearing. CV:  Good peripheral perfusion.  Borderline tachycardia, regular rhythm. Resp:  Normal effort. Speaking easily and comfortably, no accessory muscle usage nor intercostal retractions.   Abd:  No distention.  No tenderness to palpation of the abdomen. GU:  Deferred to OB/GYN   ED Results / Procedures / Treatments    Labs (all labs ordered are listed, but only abnormal results are displayed) Labs Reviewed  URINALYSIS, ROUTINE W REFLEX MICROSCOPIC - Abnormal; Notable for the following components:      Result Value   Color, Urine AMBER (*)    APPearance CLOUDY (*)    Hgb urine dipstick LARGE (*)    Protein, ur 100 (*)    Bacteria, UA RARE (*)    All other components within normal limits  HCG, QUANTITATIVE, PREGNANCY - Abnormal; Notable for the following components:   hCG, Beta Chain, Quant, S 1,454 (*)    All other components within normal limits  CBC WITH DIFFERENTIAL/PLATELET - Abnormal; Notable for the following components:   Hemoglobin 10.9 (*)    HCT 33.3 (*)    All other components within normal limits  POC URINE PREG, ED - Abnormal; Notable for the following components:   Preg Test, Ur Positive (*)    All other components within normal limits  COMPREHENSIVE METABOLIC PANEL  TYPE AND SCREEN     RADIOLOGY I  viewed and interpreted the patient's abdominal ultrasound.  See hospital course for details, but patient has retained products of conception.   PROCEDURES:  Critical Care performed: No  Procedures    IMPRESSION / MDM / ASSESSMENT AND PLAN / ED COURSE  I reviewed the triage vital signs and the nursing notes.                              Differential diagnosis includes, but is not limited to, retained products of conception, incomplete abortion, perforation.  Patient's presentation is most consistent with acute presentation with potential threat to life or bodily function.  Labs/studies ordered: Urinalysis, urine pregnancy test, hCG, CMP, CBC with differential, pelvic ultrasound  Interventions/Medications given:  Medications  methylergonovine (METHERGINE) injection 0.2 mg (0.2 mg Intramuscular Given 11/18/22 0535)    (Note:  hospital course my include additional interventions and/or labs/studies not listed above.)   Symptoms most consistent with retained products  of conception.  No evidence of infection (endometritis).  Will proceed with pelvic exam and awaiting results of ultrasound.  Lab work generally normal and appropriate including hemoglobin although that may be dropping since the bleeding is acute.     Clinical Course as of 11/18/22 0981  Wed Nov 18, 2022  0405 US PELVIC COMPLETE WITH TRANSVAGINAL I viewed and interpreted the patient's pelvic ultrasound with transvaginal.  Suspicious for retained products of conception, radiology agrees.  I am consulting OB/GYN to discuss. [CF]  1914 Consulted with Chari Manning, certified nurse midwife for [facility] clinic.  We discussed the case and she recommended that I consult directly with Dr. Feliberto Gottron.  My secretary is paging him now. [CF]  0425 Consulted with Dr. Feliberto Gottron.  He agrees with the plan for admission to L&D and observation.  He recommended Methergine 0.2 mg intramuscular.  His midwife, Sunny Schlein, will come down to evaluate the patient and put in orders. [CF]    Clinical Course User Index [CF] Loleta Rose, MD     FINAL CLINICAL IMPRESSION(S) / ED DIAGNOSES   Final diagnoses:  Retained products of conception following abortion  Abnormal uterine bleeding (AUB)     Rx / DC Orders   ED Discharge Orders     None        Note:  This document was prepared using Dragon voice recognition software and may include unintentional dictation errors.   Loleta Rose, MD 11/18/22 [phone]

## 2022-11-18 NOTE — Progress Notes (Signed)
Patient discharged. Discharge instructions given. Patient verbalizes understanding. Transported by axillary. 

## 2022-11-18 NOTE — Progress Notes (Signed)
27 yo G4P3 s/p termination 5 days ago with heavy bleeding early this am . U/S shows probable retained POC  I have counseled the pt for my recommendation of Suction D+E  Risks discussed  and all questions answered .  Prophylaxis with 200 mg Doxycycline at time of sx.

## 2022-11-18 NOTE — Anesthesia Preprocedure Evaluation (Signed)
Anesthesia Evaluation  Patient identified by MRN, date of birth, ID band Patient awake    Reviewed: Allergy & Precautions, NPO status , Patient's Chart, lab work & pertinent test results  History of Anesthesia Complications Negative for: history of anesthetic complications  Airway Mallampati: III  TM Distance: >3 FB Neck ROM: full    Dental no notable dental hx.    Pulmonary neg pulmonary ROS   Pulmonary exam normal        Cardiovascular Exercise Tolerance: Good negative cardio ROS Normal cardiovascular exam     Neuro/Psych  Headaches, neg Seizures PSYCHIATRIC DISORDERS  Depression       GI/Hepatic negative GI ROS, Neg liver ROS,,,  Endo/Other  negative endocrine ROS    Renal/GU negative Renal ROS  negative genitourinary   Musculoskeletal   Abdominal   Peds  Hematology negative hematology ROS (+)   Anesthesia Other Findings 10 weeks of pregnancy  Past Medical History: No date: Migraine  History reviewed. No pertinent surgical history.  BMI    Body Mass Index: 26.75 kg/m      Reproductive/Obstetrics (+) Pregnancy (10 weeks)                             Anesthesia Physical Anesthesia Plan  ASA: 2  Anesthesia Plan: General   Post-op Pain Management:    Induction: Intravenous  PONV Risk Score and Plan: 3 and Propofol infusion and TIVA  Airway Management Planned: Natural Airway and Simple Face Mask  Additional Equipment:   Intra-op Plan:   Post-operative Plan:   Informed Consent: I have reviewed the patients History and Physical, chart, labs and discussed the procedure including the risks, benefits and alternatives for the proposed anesthesia with the patient or authorized representative who has indicated his/her understanding and acceptance.       Plan Discussed with: Anesthesiologist  Anesthesia Plan Comments:         Anesthesia Quick Evaluation

## 2022-11-18 NOTE — Consult Note (Signed)
Consult History and Physical   SERVICE: Gynecology   Patient Name: Lauren Taylor Patient MRN:   [id number]  CC: heavy bleeding after elective abortion  HPI: Lauren Taylor, a 27 year old [id number] patient, presented with heavy bleeding post elective abortion at 10.[redacted] weeks gestation. She underwent a surgical abortion at [facility] on Saturday, July 13th. Subsequently, she experienced spotting on Sunday which progressed to heavy bleeding, necessitating changing of an overnight pad and clothing every 20 minutes.she denies any additional symptoms at this time      Past Obstetrical History: OB History     Gravida  4   Para  3   Term  2   Preterm  1   AB  1   Living  3      SAB  0   IAB  1   Ectopic  0   Multiple  0   Live Births  3           Past Gynecologic History: LMP 08/27/22 Menstrual frequency Q 4 wks lasting 4 days requiring 8 regular tampons/day.  Past Medical History: Past Medical History:  Diagnosis Date   Migraine     Past Surgical History:  History reviewed. No pertinent surgical history.  Family History:  family history includes Anxiety disorder in her mother; Depression in her mother.  Social History:  Social History   Socioeconomic History   Marital status: Single    Spouse name: Not on file   Number of children: Not on file   Years of education: Not on file   Highest education level: Not on file  Occupational History   Not on file  Tobacco Use   Smoking status: Never   Smokeless tobacco: Never  Vaping Use   Vaping status: Not on file  Substance and Sexual Activity   Alcohol use: Not Currently   Drug use: Not Currently   Sexual activity: Yes    Birth control/protection: Other-see comments    Comment: undecided  Other Topics Concern   Not on file  Social History Narrative   Not on file   Social Determinants of Health   Financial Resource Strain: Not on file  Food Insecurity: Not on file  Transportation Needs: Not  on file  Physical Activity: Not on file  Stress: Not on file  Social Connections: Not on file  Intimate Partner Violence: Not At Risk (06/13/2019)   Received from [facility], [facility]   Humiliation, Afraid, Rape, and Kick questionnaire    Fear of Current or Ex-Partner: No    Emotionally Abused: No    Physically Abused: No    Sexually Abused: No    Home Medications:  Medications reconciled in EPIC  No current facility-administered medications on file prior to encounter.   No current outpatient medications on file prior to encounter.    Allergies:  No Known Allergies  Physical Exam:  Temp:  [99.8 F (37.7 C)] 99.8 F (37.7 C) (07/17 0045) Pulse Rate:  [103] 103 (07/17 0045) Resp:  [18] 18 (07/17 0045) BP: (133)/(86) 133/86 (07/17 0045) SpO2:  [99 %] 99 % (07/17 0045) Weight:  [70.3 kg] 70.3 kg (07/17 0045)  Physical Exam Constitutional:      Appearance: Normal appearance.  Genitourinary:     Pelvic Tanner Score: 5/5.    Vaginal bleeding present.     Vaginal exam comments: Small dime sized clots.     Cervical exam comments: Closed, thick. Unable to do manual sweep.  Uterus is not tender.  HENT:     Head: Normocephalic and atraumatic.  Cardiovascular:     Rate and Rhythm: Normal rate.     Pulses: Normal pulses.  Pulmonary:     Effort: Pulmonary effort is normal.  Abdominal:     Palpations: Abdomen is soft.  Musculoskeletal:        General: Normal range of motion.     Cervical back: Normal range of motion and neck supple.  Neurological:     Mental Status: She is alert and oriented to person, place, and time.  Skin:    General: Skin is warm and dry.  Psychiatric:        Mood and Affect: Mood normal.        Behavior: Behavior normal.        Thought Content: Thought content normal.        Judgment: Judgment normal.      Labs/Studies:   CBC and Coags:  Lab Results  Component Value Date   WBC 8.2 11/18/2022   NEUTOPHILPCT 79 11/18/2022    EOSPCT 2 11/18/2022   BASOPCT 0 11/18/2022   LYMPHOPCT 14 11/18/2022   HGB 10.9 (L) 11/18/2022   HCT 33.3 (L) 11/18/2022   MCV 83.3 11/18/2022   PLT 202 11/18/2022   CMP:  Lab Results  Component Value Date   NA 137 11/18/2022   K 3.5 11/18/2022   CL 107 11/18/2022   CO2 24 11/18/2022   BUN 7 11/18/2022   CREATININE 0.65 11/18/2022   CREATININE 0.57 06/24/2021   CREATININE 0.92 09/19/2019   PROT 7.0 11/18/2022   BILITOT 0.4 11/18/2022   ALT 11 11/18/2022   AST 16 11/18/2022   ALKPHOS 45 11/18/2022   Other Labs: Lab Orders         Urinalysis, Routine w reflex microscopic -Urine, Clean Catch         hCG, quantitative, pregnancy         CBC with Differential         Comprehensive metabolic panel         POC Urine Pregnancy, ED      TVUS:   Other Imaging: US PELVIC COMPLETE WITH TRANSVAGINAL  Result Date: 11/18/2022 CLINICAL DATA:  Status post elective abortion 11/14/2022, persistent heavy vaginal bleeding. Quantitative beta HCG 1,454. EXAM: TRANSABDOMINAL AND TRANSVAGINAL ULTRASOUND OF PELVIS TECHNIQUE: Both transabdominal and transvaginal ultrasound examinations of the pelvis were performed. Transabdominal technique was performed for global imaging of the pelvis including uterus, ovaries, adnexal regions, and pelvic cul-de-sac. It was necessary to proceed with endovaginal exam following the transabdominal exam to visualize the endometrium. COMPARISON:  None Available. FINDINGS: Uterus Measurements: 10.8 x 6.6 x 9.3 cm = volume: 349 mL. The uterus is anteverted. The cervix is closed and is unremarkable. A 9 mm intramural hypoechoic solid lesion is seen within the right uterine fundus most in keeping with a small intramural fibroid. The endometrium is thickened, heterogeneous, and contains a small amount of complex fluid most in keeping with a small amount of intraluminal blood product. There is more focal, rounded soft tissue within the uterine fundus, best seen on image # 57,  demonstrating marked hypervascularity and adjacent myometrial hypervascularity suspicious for retained products of conception. No dilated vasculature is identified to suggest an acquired uterine arteriovenous malformation. Endometrium Thickness: 15 mm.  See above Right ovary Measurements: 4.3 x 1.5 x 2.3 cm = volume: 8 mL. Normal appearance/no adnexal mass. Left ovary Measurements: 3.9 x 1.4 x 2.6  cm = volume: 8 mL. Normal appearance/no adnexal mass. Other findings No abnormal free fluid. IMPRESSION: 1. Thickened, heterogeneous endometrium containing a small amount of complex fluid most in keeping with a small amount of intraluminal blood product. More focal, rounded soft tissue within the uterine fundus, demonstrating marked hypervascularity and adjacent myometrial hypervascularity suspicious for retained products. Gynecologic consultation is recommended. 2. 9 mm intramural fibroid. Electronically Signed   By: Helyn Numbers M.D.   On: 11/18/2022 03:55     Assessment / Plan:   Lauren Taylor is a 27 y.o. [id number] who presents with heavy vaginal bleeding after a IAB.  1. TVUS 2. Methergine IM 0.2 mg, then PO TID 3. Admit to Postpartum unit for further evaluation   Thank you for the opportunity to be involved with this patient's care.  ----- Chari Manning CNM Midwife [facility], Department of OB/GYN [facility]

## 2022-11-18 NOTE — Brief Op Note (Signed)
11/18/2022  12:35 PM  PATIENT:  Lauren Taylor  27 y.o. female  PRE-OPERATIVE DIAGNOSIS: menorrhagia, retained products of conception  POST-OPERATIVE DIAGNOSIS:  menorrhagia,retained products of conception  PROCEDURE:  Procedure(s): DILATATION AND EVACUATION (N/A)  SURGEON:  Surgeons and Role:    * Purnell Daigle, Ihor Austin, MD - Primary  PHYSICIAN ASSISTANT:   ASSISTANTS: none   ANESTHESIA:   IV sedation  EBL:  25 mL IOF 100 cc uo 25 cc  BLOOD ADMINISTERED:none  DRAINS: none   LOCAL MEDICATIONS USED:  OTHER toradol  SPECIMEN:  Source of Specimen:  products of conception  DISPOSITION OF SPECIMEN:  PATHOLOGY  COUNTS:  YES  TOURNIQUET:  * No tourniquets in log *  DICTATION: .Other Dictation: Dictation Number verbal  PLAN OF CARE: Discharge to home after PACU  PATIENT DISPOSITION:  PACU - hemodynamically stable.   Delay start of Pharmacological VTE agent (>24hrs) due to surgical blood loss or risk of bleeding: not applicable

## 2022-11-18 NOTE — Discharge Summary (Signed)
GYN discharge summary:  Date of admission 11/18/2022  Date of discharge 11/18/2022  Admission diagnosis menorrhagia, Retained POC s/p termination of pregnancy  Discharge diagnosis:menorrhagia, Retained POC s/p termination of pregnancy  Procedure:Suction D+E  Hospital course :pt obseved in hospital after presented to ED at [facility] with heavy vaginal bleeding . [facility] showed retained POC   Studies:  Results for orders placed or performed during the hospital encounter of 11/18/22 (from the past 24 hour(s))  hCG, quantitative, pregnancy     Status: Abnormal   Collection Time: 11/18/22 12:53 AM  Result Value Ref Range   hCG, Beta Chain, Quant, S 1,454 (H) <5 mIU/mL  CBC with Differential     Status: Abnormal   Collection Time: 11/18/22 12:53 AM  Result Value Ref Range   WBC 8.2 4.0 - 10.5 K/uL   RBC 4.00 3.87 - 5.11 MIL/uL   Hemoglobin 10.9 (L) 12.0 - 15.0 g/dL   HCT 16.1 (L) 09.6 - 04.5 %   MCV 83.3 80.0 - 100.0 fL   MCH 27.3 26.0 - 34.0 pg   MCHC 32.7 30.0 - 36.0 g/dL   RDW 40.9 81.1 - 91.4 %   Platelets 202 150 - 400 K/uL   nRBC 0.0 0.0 - 0.2 %   Neutrophils Relative % 79 %   Neutro Abs 6.4 1.7 - 7.7 K/uL   Lymphocytes Relative 14 %   Lymphs Abs 1.1 0.7 - 4.0 K/uL   Monocytes Relative 5 %   Monocytes Absolute 0.4 0.1 - 1.0 K/uL   Eosinophils Relative 2 %   Eosinophils Absolute 0.2 0.0 - 0.5 K/uL   Basophils Relative 0 %   Basophils Absolute 0.0 0.0 - 0.1 K/uL   Immature Granulocytes 0 %   Abs Immature Granulocytes 0.02 0.00 - 0.07 K/uL  Comprehensive metabolic panel     Status: None   Collection Time: 11/18/22 12:53 AM  Result Value Ref Range   Sodium 137 135 - 145 mmol/L   Potassium 3.5 3.5 - 5.1 mmol/L   Chloride 107 98 - 111 mmol/L   CO2 24 22 - 32 mmol/L   Glucose, Bld 99 70 - 99 mg/dL   BUN 7 6 - 20 mg/dL   Creatinine, Ser 7.82 0.44 - 1.00 mg/dL   Calcium 9.1 8.9 - 95.6 mg/dL   Total Protein 7.0 6.5 - 8.1 g/dL    Albumin 3.7 3.5 - 5.0 g/dL   AST 16 15 - 41 U/L   ALT 11 0 - 44 U/L   Alkaline Phosphatase 45 38 - 126 U/L   Total Bilirubin 0.4 0.3 - 1.2 mg/dL   GFR, Estimated >21 >30 mL/min   Anion gap 6 5 - 15  POC Urine Pregnancy, ED     Status: Abnormal   Collection Time: 11/18/22  1:46 AM  Result Value Ref Range   Preg Test, Ur Positive (A) Negative  Urinalysis, Routine w reflex microscopic -Urine, Clean Catch     Status: Abnormal   Collection Time: 11/18/22  1:48 AM  Result Value Ref Range   Color, Urine AMBER (A) YELLOW   APPearance CLOUDY (A) CLEAR   Specific Gravity, Urine 1.023 1.005 - 1.030   pH 7.0 5.0 - 8.0   Glucose, UA NEGATIVE NEGATIVE mg/dL   Hgb urine dipstick LARGE (A) NEGATIVE   Bilirubin Urine NEGATIVE NEGATIVE   Ketones, ur NEGATIVE NEGATIVE mg/dL   Protein, ur 865 (A) NEGATIVE mg/dL   Nitrite  NEGATIVE NEGATIVE   Leukocytes,Ua NEGATIVE NEGATIVE   RBC / HPF >50 0 - 5 RBC/hpf   WBC, UA 0-5 0 - 5 WBC/hpf   Bacteria, UA RARE (A) NONE SEEN   Squamous Epithelial / HPF NONE SEEN 0 - 5 /HPF   Mucus PRESENT   Type and screen [facility]     Status: None (Preliminary result)   Collection Time: 11/18/22  4:36 AM  Result Value Ref Range   ABO/RH(D) O POS    Antibody Screen POS    Sample Expiration 11/21/2022,2359    Antibody Identification NON SPECIFIC ANTIBODY REACTIVITY    Unit Number [id number]    Blood Component Type RED CELLS,LR    Unit division 00    Status of Unit ALLOCATED    Transfusion Status OK TO TRANSFUSE    Crossmatch Result COMPATIBLE    Unit Number [id number]    Blood Component Type RED CELLS,LR    Unit division 00    Status of Unit ALLOCATED    Transfusion Status OK TO TRANSFUSE    Crossmatch Result COMPATIBLE      Labs :as above  Disposition :d/c home in good condition . Po Methergine 0.2 mg qid x 2 days   Follow up in 2 weeks with Dr Feliberto Gottron

## 2022-11-18 NOTE — ED Notes (Signed)
Pt endorsing leg cramps following administration of methylergonovine. MD notified

## 2022-11-18 NOTE — ED Triage Notes (Signed)
Patient ambulatory to triage with steady gait, without difficulty or distress noted; pt reports having an election abortion on Saturday (was approx [redacted]wks pregnant); c/o heavy vag bleeding and lower abd cramping

## 2022-11-18 NOTE — Op Note (Signed)
Lauren, Taylor MEDICAL RECORD NO: [id number] ACCOUNT NO: [id number] DATE OF BIRTH: [birth year] FACILITY: [facility] LOCATION: [facility] PHYSICIAN: Suzy Bouchard, MD  Operative Report   DATE OF PROCEDURE: 11/18/2022  PREOPERATIVE DIAGNOSES: 1.  Menorrhagia. 2.  Retained products of conception status post pregnancy termination 4 days prior.  POSTOPERATIVE DIAGNOSES:   1.  Menorrhagia. 2.  Retained products of conception status post pregnancy termination 4 days prior.  PROCEDURE:  Suction dilation and evacuation.  SURGEON:  Suzy Bouchard, MD  ANESTHESIA:  IV sedation.  INDICATIONS:  A 27 year old gravida 4, para 3, patient is status post a pregnancy termination four days ago at [facility].  The patient returned to the Emergency Department on day of procedure with heavy vaginal bleeding and a pelvic ultrasound  that revealed a hypervascular thickness in the endometrium consistent with retained products of conception.  Blood type is O positive.  DESCRIPTION OF PROCEDURE:  After adequate IV sedation, the patient was placed in dorsal supine position with legs in candy cane stirrups.  The patient's abdomen, perineum and vagina were prepped and draped in sterile fashion.  The patient received 200 mg  intravenous doxycycline for surgical prophylaxis.  Timeout was performed.  Straight catheterization of the bladder yielded 25 mL clear urine.  Weighted speculum was placed in the posterior vaginal vault and the anterior cervix was grasped with a single  tooth tenaculum.  Cervix was dilated with #20 Hanks dilator without difficulty.  Uterus sounded to 8 cm.  A #8 flexible suction curette was then placed into the endometrial cavity and suction curettage performed with moderate amount of tissue consistent  with products of conception.  Sharp curettage was performed with good uterine cry throughout and repeat suction curette yielded a small additional amount of tissue.  Good  hemostasis noted.  There were no complications.  The patient received 30 mg  intravenous Toradol at the end of the procedure.  ESTIMATED BLOOD LOSS:  25 mL.  INTRAOPERATIVE FLUIDS:  100 mL.  DISPOSITION:  The patient was taken to recovery room in good condition.   PUS D: 11/18/2022 12:46:54 pm T: 11/18/2022 1:46:00 pm  JOB: [id number]/ [id number]

## 2022-11-19 ENCOUNTER — Encounter: Payer: Self-pay | Admitting: Obstetrics and Gynecology

## 2022-11-19 LAB — TYPE AND SCREEN
ABO/RH(D): O POS
Antibody Screen: POSITIVE
Unit division: 0
Unit division: 0

## 2022-11-19 LAB — BPAM RBC
Blood Product Expiration Date: 202408162359
Blood Product Expiration Date: 202408162359
Unit Type and Rh: 5100
Unit Type and Rh: 5100

## 2022-11-25 NOTE — Anesthesia Postprocedure Evaluation (Signed)
Anesthesia Post Note  Patient: [name]  Procedure(s) Performed: DILATATION AND EVACUATION (Vagina )  Patient location during evaluation: PACU Anesthesia Type: General Level of consciousness: awake and alert Pain management: pain level controlled Vital Signs Assessment: post-procedure vital signs reviewed and stable Respiratory status: spontaneous breathing, nonlabored ventilation, respiratory function stable and patient connected to nasal cannula oxygen Cardiovascular status: blood pressure returned to baseline and stable Postop Assessment: no apparent nausea or vomiting Anesthetic complications: no   No notable events documented.   Last Vitals:  Vitals:   11/18/22 1335 11/18/22 1455  BP: 99/75 100/65  Pulse: 69 65  Resp: 17 18  Temp: 36.5 C 36.5 C  SpO2: 100% 99%    Last Pain:  Vitals:   11/18/22 1455  TempSrc: Oral  PainSc:                  Lauren Taylor

## 2023-06-25 ENCOUNTER — Ambulatory Visit
Admission: EM | Admit: 2023-06-25 | Discharge: 2023-06-25 | Disposition: A | Discharge status: Home / Self Care | Payer: Medicaid Other | Attending: Emergency Medicine | Admitting: Emergency Medicine

## 2023-06-25 DIAGNOSIS — N390 Urinary tract infection, site not specified: Secondary | ICD-10-CM | POA: Diagnosis present

## 2023-06-25 LAB — URINALYSIS, W/ REFLEX TO CULTURE (INFECTION SUSPECTED)
Bilirubin Urine: NEGATIVE
Glucose, UA: NEGATIVE mg/dL
Nitrite: POSITIVE — AB
Protein, ur: 30 mg/dL — AB
Specific Gravity, Urine: 1.025 (ref 1.005–1.030)
WBC, UA: >50 WBC/hpf (ref 0–5)
pH: 7 (ref 5.0–8.0)

## 2023-06-25 MED ORDER — NITROFURANTOIN MONOHYD MACRO 100 MG PO CAPS: 100.0000 mg | ORAL_CAPSULE | Freq: Two times a day (BID) | ORAL | 0 refills | Status: DC

## 2023-06-25 MED ORDER — PHENAZOPYRIDINE HCL 200 MG PO TABS: 200.0000 mg | ORAL_TABLET | Freq: Three times a day (TID) | ORAL | 0 refills | Status: DC

## 2023-06-25 NOTE — ED Triage Notes (Signed)
Patient reports that lower back pain, frequent urination, and felling like she can't empty her bladder -- Symptoms started Monday.  Patient reports she has been taking AZO

## 2023-06-25 NOTE — ED Provider Notes (Signed)
[facility]    CSN: [id number] Arrival date & time: 06/25/23  1826      History   Chief Complaint Chief Complaint  Patient presents with   Urinary Frequency   Back Pain    HPI Lauren Taylor is a 28 y.o. female.   HPI  28 year old female with past medical history significant for migraine headaches presents for evaluation of UTI symptoms that started 4 days ago.  These include burning with urination along with urinary urgency and frequency, feeling like she cannot completely empty her bladder, and low back pain.  She denies any fever, blood in her urine, cloudiness to her urine, vaginal discharge or itching.  Also no abdominal pain.  She has been taking Azo for her symptoms.  Past Medical History:  Diagnosis Date   Migraine     Patient Active Problem List   Diagnosis Date Noted   Episode of heavy vaginal bleeding 11/18/2022   Pregnancy related bilateral lower abdominal pain, antepartum 06/27/2021   Preterm premature rupture of membranes (PPROM) delivered, current hospitalization 06/27/2021   Nausea and vomiting during pregnancy 06/24/2021   [redacted] weeks gestation of pregnancy    Obesity, unspecified 07/16/2017   Attention deficit hyperactivity disorder 01/27/2016   Acute depression 07/18/2014    Past Surgical History:  Procedure Laterality Date   DILATION AND EVACUATION N/A 11/18/2022   Procedure: DILATATION AND EVACUATION;  Surgeon: Suzy Bouchard, MD;  Location: [facility];  Service: Gynecology;  Laterality: N/A;    OB History     Gravida  4   Para  3   Term  2   Preterm  1   AB  1   Living  3      SAB  0   IAB  1   Ectopic  0   Multiple  0   Live Births  3            Home Medications    Prior to Admission medications   Medication Sig Start Date End Date Taking? Authorizing Provider  nitrofurantoin, macrocrystal-monohydrate, (MACROBID) 100 MG capsule Take 1 capsule (100 mg total) by mouth 2 (two) times daily. 06/25/23  Yes  Becky Augusta, NP  phenazopyridine (PYRIDIUM) 200 MG tablet Take 1 tablet (200 mg total) by mouth 3 (three) times daily. 06/25/23  Yes Becky Augusta, NP    Family History Family History  Problem Relation Age of Onset   Anxiety disorder Mother    Depression Mother     Social History Social History   Tobacco Use   Smoking status: Never   Smokeless tobacco: Never  Substance Use Topics   Alcohol use: Not Currently   Drug use: Not Currently     Allergies   Patient has no known allergies.   Review of Systems Review of Systems  Constitutional:  Negative for fever.  Gastrointestinal:  Positive for abdominal pain. Negative for nausea and vomiting.  Genitourinary:  Positive for dysuria, frequency and urgency. Negative for hematuria, vaginal discharge and vaginal pain.  Musculoskeletal:  Positive for back pain.     Physical Exam Triage Vital Signs ED Triage Vitals  Encounter Vitals Group     BP      Systolic BP Percentile      Diastolic BP Percentile      Pulse      Resp      Temp      Temp src      SpO2      Weight  Height      Head Circumference      Peak Flow      Pain Score      Pain Loc      Pain Education      Exclude from Growth Chart    No data found.  Updated Vital Signs BP 104/73 (BP Location: Left Arm)   Pulse 78   Temp 99 F (37.2 C)   Ht 5\' 3"  (1.6 m)   Wt 170 lb (77.1 kg)   SpO2 98%   BMI 30.11 kg/m   Visual Acuity Right Eye Distance:   Left Eye Distance:   Bilateral Distance:    Right Eye Near:   Left Eye Near:    Bilateral Near:     Physical Exam Vitals and nursing note reviewed.  Constitutional:      Appearance: Normal appearance. She is not ill-appearing.  HENT:     Head: Normocephalic and atraumatic.  Cardiovascular:     Rate and Rhythm: Normal rate and regular rhythm.     Pulses: Normal pulses.     Heart sounds: Normal heart sounds. No murmur heard.    No friction rub. No gallop.  Pulmonary:     Effort: Pulmonary  effort is normal.     Breath sounds: Normal breath sounds. No wheezing, rhonchi or rales.  Abdominal:     Tenderness: There is no abdominal tenderness. There is no right CVA tenderness, left CVA tenderness, guarding or rebound.  Neurological:     Mental Status: She is alert.      UC Treatments / Results  Labs (all labs ordered are listed, but only abnormal results are displayed) Labs Reviewed  URINALYSIS, W/ REFLEX TO CULTURE (INFECTION SUSPECTED) - Abnormal; Notable for the following components:      Result Value   APPearance HAZY (*)    Hgb urine dipstick TRACE (*)    Ketones, ur TRACE (*)    Protein, ur 30 (*)    Nitrite POSITIVE (*)    Leukocytes,Ua SMALL (*)    Bacteria, UA MANY (*)    All other components within normal limits  URINE CULTURE    EKG   Radiology No results found.  Procedures Procedures (including critical care time)  Medications Ordered in UC Medications - No data to display  Initial Impression / Assessment and Plan / UC Course  I have reviewed the triage vital signs and the nursing notes.  Pertinent labs & imaging results that were available during my care of the patient were reviewed by me and considered in my medical decision making (see chart for details).   Patient is a nontoxic-appearing 28 year old female presenting for evaluation of UTI symptoms as outlined HPI above.  Her physical exam reveals an abdomen that is soft and nontender no CVA tenderness.  She denies any vaginal discharge or itching.  I will order urinalysis to evaluate the presence of UTI.  Urinalysis shows hazy appearance with trace hemoglobin, trace ketones, 30 protein, nitrite positive, small leukocyte esterase.  Significant skin contamination with 21-50 squamous epithelial cells, greater than 50 WBCs, 6-10 RBCs, many bacteria, and WBC clumps.  I will order a recollection for culture if patient is able to provide it as her sample was contaminated.  I will also discharge her  home on Macrobid 100 mg twice daily for 5 days.  I will also prescribe Purdon that she can use for urinary discomfort.  Return precautions reviewed.   Final Clinical Impressions(s) / UC Diagnoses  Final diagnoses:  Lower urinary tract infectious disease     Discharge Instructions      Take the Macrobid twice daily for 5 days with food for treatment of urinary tract infection.  Use the Pyridium every 8 hours as needed for urinary discomfort.  This will turn your urine a bright red-orange.  Increase your oral fluid intake so that you increase your urine production and or flushing your urinary system.  Take an over-the-counter probiotic, such as Culturelle-Align-Activia, 1 hour after each dose of antibiotic to prevent diarrhea or yeast infections from forming.  We will culture urine and change the antibiotics if necessary.  Return for reevaluation, or see your primary care provider, for any new or worsening symptoms.      ED Prescriptions     Medication Sig Dispense Auth. Provider   nitrofurantoin, macrocrystal-monohydrate, (MACROBID) 100 MG capsule Take 1 capsule (100 mg total) by mouth 2 (two) times daily. 10 capsule Becky Augusta, NP   phenazopyridine (PYRIDIUM) 200 MG tablet Take 1 tablet (200 mg total) by mouth 3 (three) times daily. 6 tablet Becky Augusta, NP      PDMP not reviewed this encounter.   Becky Augusta, NP 06/25/23 Jerene Bears

## 2023-06-25 NOTE — Discharge Instructions (Addendum)

## 2023-06-27 LAB — URINE CULTURE
Culture: 30000 — AB
Special Requests: NORMAL

## 2023-07-21 ENCOUNTER — Ambulatory Visit
Admission: EM | Admit: 2023-07-21 | Discharge: 2023-07-21 | Disposition: A | Discharge status: Home / Self Care | Attending: Physician Assistant | Admitting: Physician Assistant

## 2023-07-21 DIAGNOSIS — Z3201 Encounter for pregnancy test, result positive: Secondary | ICD-10-CM | POA: Insufficient documentation

## 2023-07-21 DIAGNOSIS — N3000 Acute cystitis without hematuria: Secondary | ICD-10-CM | POA: Diagnosis present

## 2023-07-21 DIAGNOSIS — R350 Frequency of micturition: Secondary | ICD-10-CM | POA: Insufficient documentation

## 2023-07-21 LAB — URINALYSIS, W/ REFLEX TO CULTURE (INFECTION SUSPECTED)

## 2023-07-21 LAB — PREGNANCY, URINE: Preg Test, Ur: POSITIVE — AB

## 2023-07-21 MED ORDER — PHENAZOPYRIDINE HCL 200 MG PO TABS: 200.0000 mg | ORAL_TABLET | Freq: Three times a day (TID) | ORAL | 0 refills | Status: AC

## 2023-07-21 MED ORDER — CEPHALEXIN 500 MG PO CAPS: 500.0000 mg | ORAL_CAPSULE | Freq: Three times a day (TID) | ORAL | 0 refills | Status: AC

## 2023-07-21 NOTE — ED Triage Notes (Signed)
 Urinary frequency, urinary urgency, lower abdominal cramping x 2 days. Taking AZO.

## 2023-07-21 NOTE — ED Provider Notes (Signed)
 [facility]    CSN: [id number] Arrival date & time: 07/21/23  1532      History   Chief Complaint Chief Complaint  Patient presents with   Urinary Frequency    HPI Chauna Osoria is a 28 y.o. female presenting for 2-day history of lower abdominal cramping, urinary frequency/urgency and dysuria.  Denies fever, fatigue, flank pain, hematuria, vaginal discharge/itching or odor.  No concern for STIs reported.  LMP 06/19/2023.  Patient says she has been off of her oral contraceptives for the past week.  States she ran out of the medicine.  Review of medical record shows 4 refills put on the medication after was prescribed on 06/03/2023.  Patient had a UTI last month and was treated with Macrobid.  HPI  Past Medical History:  Diagnosis Date   Migraine     Patient Active Problem List   Diagnosis Date Noted   Episode of heavy vaginal bleeding 11/18/2022   Pregnancy related bilateral lower abdominal pain, antepartum 06/27/2021   Preterm premature rupture of membranes (PPROM) delivered, current hospitalization 06/27/2021   Nausea and vomiting during pregnancy 06/24/2021   [redacted] weeks gestation of pregnancy    Obesity, unspecified 07/16/2017   Attention deficit hyperactivity disorder 01/27/2016   Acute depression 07/18/2014    Past Surgical History:  Procedure Laterality Date   DILATION AND EVACUATION N/A 11/18/2022   Procedure: DILATATION AND EVACUATION;  Surgeon: Suzy Bouchard, MD;  Location: [facility];  Service: Gynecology;  Laterality: N/A;    OB History     Gravida  4   Para  3   Term  2   Preterm  1   AB  1   Living  3      SAB  0   IAB  1   Ectopic  0   Multiple  0   Live Births  3            Home Medications    Prior to Admission medications   Medication Sig Start Date End Date Taking? Authorizing Provider  cephALEXin (KEFLEX) 500 MG capsule Take 1 capsule (500 mg total) by mouth 3 (three) times daily for 7 days. 07/21/23 07/28/23  Yes Shirlee Latch, PA-C  phenazopyridine (PYRIDIUM) 200 MG tablet Take 1 tablet (200 mg total) by mouth 3 (three) times daily. 07/21/23  Yes Shirlee Latch, PA-C    Family History Family History  Problem Relation Age of Onset   Anxiety disorder Mother    Depression Mother     Social History Social History   Tobacco Use   Smoking status: Never   Smokeless tobacco: Never  Vaping Use   Vaping status: Never Used  Substance Use Topics   Alcohol use: Not Currently   Drug use: Not Currently     Allergies   Patient has no known allergies.   Review of Systems Review of Systems  Constitutional:  Negative for chills, fatigue and fever.  Gastrointestinal:  Positive for abdominal pain. Negative for diarrhea, nausea and vomiting.  Genitourinary:  Positive for dysuria, frequency and urgency. Negative for decreased urine volume, flank pain, hematuria, pelvic pain, vaginal bleeding, vaginal discharge and vaginal pain.  Musculoskeletal:  Negative for back pain.  Skin:  Negative for rash.     Physical Exam Triage Vital Signs ED Triage Vitals  Encounter Vitals Group     BP      Systolic BP Percentile      Diastolic BP Percentile  Pulse      Resp      Temp      Temp src      SpO2      Weight      Height      Head Circumference      Peak Flow      Pain Score      Pain Loc      Pain Education      Exclude from Growth Chart    No data found.  Updated Vital Signs BP 139/81 (BP Location: Left Arm)   Pulse (!) 109   Temp 98.4 F (36.9 C) (Oral)   Resp 14   LMP 06/19/2023 (Approximate)   SpO2 95%   Breastfeeding No     Physical Exam Vitals and nursing note reviewed.  Constitutional:      General: She is not in acute distress.    Appearance: Normal appearance. She is not ill-appearing or toxic-appearing.  HENT:     Head: Normocephalic and atraumatic.  Eyes:     General: No scleral icterus.       Right eye: No discharge.        Left eye: No discharge.      Conjunctiva/sclera: Conjunctivae normal.  Cardiovascular:     Rate and Rhythm: Regular rhythm. Tachycardia present.     Heart sounds: Normal heart sounds.  Pulmonary:     Effort: Pulmonary effort is normal. No respiratory distress.     Breath sounds: Normal breath sounds.  Abdominal:     Palpations: Abdomen is soft.     Tenderness: There is no abdominal tenderness. There is no right CVA tenderness or left CVA tenderness.  Musculoskeletal:     Cervical back: Neck supple.  Skin:    General: Skin is dry.  Neurological:     General: No focal deficit present.     Mental Status: She is alert. Mental status is at baseline.     Motor: No weakness.     Gait: Gait normal.  Psychiatric:        Mood and Affect: Mood normal.        Behavior: Behavior normal.      UC Treatments / Results  Labs (all labs ordered are listed, but only abnormal results are displayed) Labs Reviewed  URINALYSIS, W/ REFLEX TO CULTURE (INFECTION SUSPECTED) - Abnormal; Notable for the following components:      Result Value   Color, Urine ORANGE (*)    APPearance CLOUDY (*)    Glucose, UA   (*)    Value: TEST NOT REPORTED DUE TO COLOR INTERFERENCE OF URINE PIGMENT   Hgb urine dipstick   (*)    Value: TEST NOT REPORTED DUE TO COLOR INTERFERENCE OF URINE PIGMENT   Bilirubin Urine   (*)    Value: TEST NOT REPORTED DUE TO COLOR INTERFERENCE OF URINE PIGMENT   Ketones, ur   (*)    Value: TEST NOT REPORTED DUE TO COLOR INTERFERENCE OF URINE PIGMENT   Protein, ur   (*)    Value: TEST NOT REPORTED DUE TO COLOR INTERFERENCE OF URINE PIGMENT   Nitrite   (*)    Value: TEST NOT REPORTED DUE TO COLOR INTERFERENCE OF URINE PIGMENT   Leukocytes,Ua   (*)    Value: TEST NOT REPORTED DUE TO COLOR INTERFERENCE OF URINE PIGMENT   Bacteria, UA FEW (*)    All other components within normal limits  PREGNANCY, URINE - Abnormal; Notable for the following  components:   Preg Test, Ur POSITIVE (*)    All other components within  normal limits  URINE CULTURE      Component Ref Range & Units (hover) 3 wk ago  Specimen Description URINE, CLEAN CATCH Performed at [facility], [address] [place], [place] [address]  Special Requests Normal Performed at [facility], [address], [place], [place] [address]  Culture 30,000 COLONIES/mL ENTEROBACTER CLOACAE Abnormal   Report Status 06/27/2023 FINAL  Organism ID, Bacteria ENTEROBACTER CLOACAE Abnormal   Resulting Agency [facility]     Susceptibility   Enterobacter cloacae    MIC    CEFEPIME <=0.12 SENS... Sensitive    CIPROFLOXACIN <=0.25 SENS... Sensitive    GENTAMICIN <=1 SENSITIVE Sensitive    IMIPENEM 0.5 SENSITIVE Sensitive    NITROFURANTOIN 32 SENSITIVE Sensitive    PIP/TAZO <=4 SENSITI... Sensitive    TRIMETH/SULFA <=20 SENSIT... Sensitive           Susceptibility Comments  Enterobacter cloacae  30,000 COLONIES/mL ENTEROBACTER CLOACAE     Specimen Collected: 06/25/23 18:54 Last Resulted: 06/27/23 09:09   EKG   Radiology No results found.  Procedures Procedures (including critical care time)  Medications Ordered in UC Medications - No data to display  Initial Impression / Assessment and Plan / UC Course  I have reviewed the triage vital signs and the nursing notes.  Pertinent labs & imaging results that were available during my care of the patient were reviewed by me and considered in my medical decision making (see chart for details).   28 year old female presents for 2-day history of dysuria, frequency, urgency and lower abdominal cramping.  LMP 06/19/2023.  Seen here last month for UTI and treated with Macrobid.  Patient is afebrile and overall well-appearing.  No acute distress.  On exam mild suprapubic tenderness.  No CVA tenderness.  Chest clear.  Heart regular rhythm but mildly tachycardic.  Urinalysis obtained.  Patient currently on AZO.  UA is positive for cloudy urine with squamous epithelial  cells, white blood cells and clumps of white blood cells.  Urine will be sent for culture.  Possible UTI.  Urine pregnancy test obtained.  Reviewed previous urine culture results from 06/25/2023.  Patient was positive for Enterobacter Cloacae which was pansensitive.  Positive pregnancy.  Reviewed the result with patient.  Advised her to follow-up with her OB/GYN.  Advised not to take any more of the birth control pills as she is pregnant.  Discussed using only safe medications in pregnancy.  Follow-up with OB/GYN to determine date of pregnancy.  Sent Keflex to pharmacy of urinary tract infection.  Also sent Pyridium.  Reviewed we may change treatment based on culture if needed.   Final Clinical Impressions(s) / UC Diagnoses   Final diagnoses:  Acute cystitis without hematuria  Urinary frequency  Positive pregnancy test     Discharge Instructions      UTI: Based on either symptoms or urinalysis, you may have a urinary tract infection. We will send the urine for culture and call with results in a few days. Begin antibiotics at this time. Your symptoms should be much improved over the next 2-3 days. Increase rest and fluid intake. If for some reason symptoms are worsening or not improving after a couple of days or the urine culture determines the antibiotics you are taking will not treat the infection, the antibiotics may be changed. Return or go to ER for fever, back pain, worsening urinary pain,  discharge, increased blood in urine. May take Tylenol or Motrin OTC for pain relief or consider AZO if no contraindications   -Pregnancy test was positive.  Make an appointment with your OB/GYN for follow-up.     ED Prescriptions     Medication Sig Dispense Auth. Provider   cephALEXin (KEFLEX) 500 MG capsule Take 1 capsule (500 mg total) by mouth 3 (three) times daily for 7 days. 21 capsule Eusebio Friendly B, PA-C   phenazopyridine (PYRIDIUM) 200 MG tablet Take 1 tablet (200 mg total) by mouth 3  (three) times daily. 6 tablet Gareth Morgan      PDMP not reviewed this encounter.   Shirlee Latch, PA-C 07/21/23 1635

## 2023-07-21 NOTE — Discharge Instructions (Signed)
 UTI: Based on either symptoms or urinalysis, you may have a urinary tract infection. We will send the urine for culture and call with results in a few days. Begin antibiotics at this time. Your symptoms should be much improved over the next 2-3 days. Increase rest and fluid intake. If for some reason symptoms are worsening or not improving after a couple of days or the urine culture determines the antibiotics you are taking will not treat the infection, the antibiotics may be changed. Return or go to ER for fever, back pain, worsening urinary pain, discharge, increased blood in urine. May take Tylenol or Motrin OTC for pain relief or consider AZO if no contraindications   -Pregnancy test was positive.  Make an appointment with your OB/GYN for follow-up.

## 2023-07-22 LAB — URINE CULTURE

## 2023-09-21 ENCOUNTER — Encounter: Payer: Self-pay | Admitting: Emergency Medicine

## 2023-09-21 ENCOUNTER — Ambulatory Visit
Admission: EM | Admit: 2023-09-21 | Discharge: 2023-09-21 | Disposition: A | Discharge status: Home / Self Care | Attending: Physician Assistant | Admitting: Physician Assistant

## 2023-09-21 DIAGNOSIS — Z113 Encounter for screening for infections with a predominantly sexual mode of transmission: Secondary | ICD-10-CM

## 2023-09-21 DIAGNOSIS — N898 Other specified noninflammatory disorders of vagina: Secondary | ICD-10-CM | POA: Diagnosis not present

## 2023-09-21 DIAGNOSIS — R197 Diarrhea, unspecified: Secondary | ICD-10-CM

## 2023-09-21 DIAGNOSIS — R109 Unspecified abdominal pain: Secondary | ICD-10-CM

## 2023-09-21 DIAGNOSIS — R630 Anorexia: Secondary | ICD-10-CM | POA: Diagnosis present

## 2023-09-21 DIAGNOSIS — L292 Pruritus vulvae: Secondary | ICD-10-CM | POA: Insufficient documentation

## 2023-09-21 LAB — CBC WITH DIFFERENTIAL/PLATELET
Abs Immature Granulocytes: 0.01 10*3/uL (ref 0.00–0.07)
Basophils Absolute: 0 10*3/uL (ref 0.0–0.1)
Basophils Relative: 0 %
Eosinophils Absolute: 0.3 10*3/uL (ref 0.0–0.5)
Eosinophils Relative: 3 %
HCT: 36.7 % (ref 36.0–46.0)
Hemoglobin: 11.9 g/dL — ABNORMAL LOW (ref 12.0–15.0)
Immature Granulocytes: 0 %
Lymphocytes Relative: 15 %
Lymphs Abs: 1.2 10*3/uL (ref 0.7–4.0)
MCH: 25.3 pg — ABNORMAL LOW (ref 26.0–34.0)
MCHC: 32.4 g/dL (ref 30.0–36.0)
MCV: 78.1 fL — ABNORMAL LOW (ref 80.0–100.0)
Monocytes Absolute: 0.4 10*3/uL (ref 0.1–1.0)
Monocytes Relative: 5 %
Neutro Abs: 6.3 10*3/uL (ref 1.7–7.7)
Neutrophils Relative %: 77 %
Platelets: 240 10*3/uL (ref 150–400)
RBC: 4.7 MIL/uL (ref 3.87–5.11)
RDW: 14 % (ref 11.5–15.5)
WBC: 8.2 10*3/uL (ref 4.0–10.5)
nRBC: 0 % (ref 0.0–0.2)

## 2023-09-21 LAB — COMPREHENSIVE METABOLIC PANEL WITH GFR
ALT: 13 U/L (ref 0–44)
AST: 18 U/L (ref 15–41)
Albumin: 4.2 g/dL (ref 3.5–5.0)
Alkaline Phosphatase: 50 U/L (ref 38–126)
Anion gap: 7 (ref 5–15)
BUN: 9 mg/dL (ref 6–20)
CO2: 23 mmol/L (ref 22–32)
Calcium: 8.6 mg/dL — ABNORMAL LOW (ref 8.9–10.3)
Chloride: 109 mmol/L (ref 98–111)
Creatinine, Ser: 0.87 mg/dL (ref 0.44–1.00)
GFR, Estimated: >60 mL/min (ref >60)
Glucose, Bld: 94 mg/dL (ref 70–99)
Potassium: 3.6 mmol/L (ref 3.5–5.1)
Sodium: 139 mmol/L (ref 135–145)
Total Bilirubin: 0.6 mg/dL (ref 0.0–1.2)
Total Protein: 7.9 g/dL (ref 6.5–8.1)

## 2023-09-21 LAB — URINALYSIS, W/ REFLEX TO CULTURE (INFECTION SUSPECTED)
Glucose, UA: NEGATIVE mg/dL
Leukocytes,Ua: NEGATIVE
Nitrite: NEGATIVE
Specific Gravity, Urine: 1.025 (ref 1.005–1.030)
pH: 6 (ref 5.0–8.0)

## 2023-09-21 LAB — PREGNANCY, URINE: Preg Test, Ur: NEGATIVE

## 2023-09-21 LAB — LIPASE, BLOOD: Lipase: 31 U/L (ref 11–51)

## 2023-09-21 NOTE — Discharge Instructions (Addendum)
-   You do not have a UTI and your pregnancy test was negative. - We obtained a CBC, CMP and lipase.  Your white blood cell count was normal. Your liver enzymes were also normal. - Your pain has improved and your white blood cell count is normal so my suspicion for gallbladder infection is low but I still think you may have gallstones versus viral stomach bug. - Please make an appointment with your PCP if this continues.  You will need to have an ultrasound of your abdomen to assess for possible stones. - If you get a fever at the pain worsens to the level it was last night you need to go immediately to the ER because there could be an infection.

## 2023-09-21 NOTE — ED Triage Notes (Signed)
 Pt presents with RUQ pain and diarrhea that started last night after eating. Pt also has vaginal itching that started 2 days.

## 2023-09-21 NOTE — ED Provider Notes (Signed)
 [facility]    CSN: [id number] Arrival date & time: 09/21/23  1721      History   Chief Complaint Chief Complaint  Patient presents with   Abdominal Pain    HPI Lauren Taylor is a 28 y.o. female presenting for intermittent right upper quadrant pain and diarrhea since last night.  Patient reports pain was 8 out of 10 last night.  It has lessened up and is currently about a 3 out of 10.  She reports reduced appetite but denies nausea or vomiting.  No flank pain, dysuria, frequency, urgency.  Mild vaginal discharge.  Does state concern for STIs but like to be tested.  History of positive pregnancy test a couple months ago but patient reports having a miscarriage.  Urine pregnancy today is negative.  She has taken ibuprofen  and Tylenol  early this morning but none in the past several hours.  She is afebrile at 98.9 degrees and is reporting minimal discomfort at this time.  Patient contact her primary care office for appointment and they told her she needed to go to urgent care to have her gallbladder checked.  HPI  Past Medical History:  Diagnosis Date   Migraine     Patient Active Problem List   Diagnosis Date Noted   Episode of heavy vaginal bleeding 11/18/2022   Pregnancy related bilateral lower abdominal pain, antepartum 06/27/2021   Preterm premature rupture of membranes (PPROM) delivered, current hospitalization 06/27/2021   Nausea and vomiting during pregnancy 06/24/2021   [redacted] weeks gestation of pregnancy    Obesity, unspecified 07/16/2017   Attention deficit hyperactivity disorder 01/27/2016   Acute depression 07/18/2014    Past Surgical History:  Procedure Laterality Date   DILATION AND EVACUATION N/A 11/18/2022   Procedure: DILATATION AND EVACUATION;  Surgeon: Carolynn Citrin, MD;  Location: [facility];  Service: Gynecology;  Laterality: N/A;    OB History     Gravida  4   Para  3   Term  2   Preterm  1   AB  1   Living  3      SAB  0    IAB  1   Ectopic  0   Multiple  0   Live Births  3            Home Medications    Prior to Admission medications   Medication Sig Start Date End Date Taking? Authorizing Provider  JUNEL FE 1.5/30 1.5-30 MG-MCG tablet Take 1 tablet by mouth daily.    [provider]  phenazopyridine  (PYRIDIUM ) 200 MG tablet Take 1 tablet (200 mg total) by mouth 3 (three) times daily. 07/21/23   Floydene Hy, PA-C    Family History Family History  Problem Relation Age of Onset   Anxiety disorder Mother    Depression Mother     Social History Social History   Tobacco Use   Smoking status: Never   Smokeless tobacco: Never  Vaping Use   Vaping status: Never Used  Substance Use Topics   Alcohol use: Not Currently   Drug use: Not Currently     Allergies   Patient has no known allergies.   Review of Systems Review of Systems  Constitutional:  Positive for appetite change. Negative for fatigue and fever.  Cardiovascular:  Negative for chest pain.  Gastrointestinal:  Positive for abdominal pain and diarrhea. Negative for blood in stool, constipation, nausea and vomiting.  Genitourinary:  Positive for vaginal discharge. Negative for dysuria,  flank pain, frequency, hematuria, pelvic pain, urgency, vaginal bleeding and vaginal pain.  Musculoskeletal:  Negative for back pain.  Skin:  Negative for rash.     Physical Exam Triage Vital Signs ED Triage Vitals  Encounter Vitals Group     BP 09/21/23 1810 118/78     Systolic BP Percentile --      Diastolic BP Percentile --      Pulse Rate 09/21/23 1810 93     Resp 09/21/23 1810 16     Temp 09/21/23 1810 98.9 F (37.2 C)     Temp Source 09/21/23 1810 Oral     SpO2 09/21/23 1810 99 %     Weight --      Height --      Head Circumference --      Peak Flow --      Pain Score 09/21/23 1807 7     Pain Loc --      Pain Education --      Exclude from Growth Chart --    No data found.  Updated Vital Signs BP 118/78 (BP  Location: Left Arm)   Pulse 93   Temp 98.9 F (37.2 C) (Oral)   Resp 16   SpO2 99%    Physical Exam Vitals and nursing note reviewed.  Constitutional:      General: She is not in acute distress.    Appearance: Normal appearance. She is not ill-appearing or toxic-appearing.  HENT:     Head: Normocephalic and atraumatic.  Eyes:     General: No scleral icterus.       Right eye: No discharge.        Left eye: No discharge.     Conjunctiva/sclera: Conjunctivae normal.  Cardiovascular:     Rate and Rhythm: Normal rate and regular rhythm.     Heart sounds: Normal heart sounds.  Pulmonary:     Effort: Pulmonary effort is normal. No respiratory distress.     Breath sounds: Normal breath sounds.  Abdominal:     Palpations: Abdomen is soft.     Tenderness: There is abdominal tenderness (LUQ). There is no right CVA tenderness, left CVA tenderness or guarding.  Musculoskeletal:     Cervical back: Neck supple.  Skin:    General: Skin is dry.  Neurological:     General: No focal deficit present.     Mental Status: She is alert. Mental status is at baseline.     Motor: No weakness.     Gait: Gait normal.  Psychiatric:        Mood and Affect: Mood normal.        Behavior: Behavior normal.      UC Treatments / Results  Labs (all labs ordered are listed, but only abnormal results are displayed) Labs Reviewed  URINALYSIS, W/ REFLEX TO CULTURE (INFECTION SUSPECTED) - Abnormal; Notable for the following components:      Result Value   APPearance HAZY (*)    Hgb urine dipstick MODERATE (*)    Bilirubin Urine SMALL (*)    Ketones, ur TRACE (*)    Protein, ur TRACE (*)    Bacteria, UA FEW (*)    All other components within normal limits  COMPREHENSIVE METABOLIC PANEL WITH GFR - Abnormal; Notable for the following components:   Calcium 8.6 (*)    All other components within normal limits  CBC WITH DIFFERENTIAL/PLATELET - Abnormal; Notable for the following components:   Hemoglobin  11.9 (*)  MCV 78.1 (*)    MCH 25.3 (*)    All other components within normal limits  PREGNANCY, URINE  LIPASE, BLOOD  CERVICOVAGINAL ANCILLARY ONLY    EKG   Radiology No results found.  Procedures Procedures (including critical care time)  Medications Ordered in UC Medications - No data to display  Initial Impression / Assessment and Plan / UC Course  I have reviewed the triage vital signs and the nursing notes.  Pertinent labs & imaging results that were available during my care of the patient were reviewed by me and considered in my medical decision making (see chart for details).   28 year old female presents for intermittent right upper quadrant pain, loss of appetite and diarrhea since last night.  Pain has significantly improved since last night but still concern for possible gallbladder problem.  No associated fever, vomiting, urinary symptoms.  Reports mild vaginal discharge.  Would like STI screening.  Vital stable and normal and she is overall well-appearing.  No acute distress.  On exam has no tenderness of right upper quadrant but mild tenderness of the left upper quadrant without guarding or rebound.  No CVA tenderness.  Chest clear.  Heart regular rate and rhythm.  Urine pregnancy negative.  Urinalysis not consistent with UTI.  Patient performs self swab for GC/chlamydia/trichomoniasis/BV and yeast.  Will treat with medication once results return.  Obtaining CBC and CMP as well as lipase.  If any significant abnormalities patient will be advised to go to emergency department for immediate evaluation of gallbladder pain. If labs acceptable may  be able to wait and make appt with PCP.  No significant findings for CBC, lipase and CMP.  Viral gastritis versus gallstones.  Supportive care at this time of increasing rest and fluids and bland diet.  Thoroughly reviewed making appointments follow-up with PCP but if pain worsens or she develops fever she should go  immediately to the ER.  Patient is agreeable.   Final Clinical Impressions(s) / UC Diagnoses   Final diagnoses:  Abdominal pain, unspecified abdominal location  Diarrhea, unspecified type  Loss of appetite  Vaginal discharge  Routine screening for STI (sexually transmitted infection)     Discharge Instructions      - You do not have a UTI and your pregnancy test was negative. - We obtained a CBC, CMP and lipase.  Your white blood cell count was normal. Your liver enzymes were also normal. - Your pain has improved and your white blood cell count is normal so my suspicion for gallbladder infection is low but I still think you may have gallstones versus viral stomach bug. - Please make an appointment with your PCP if this continues.  You will need to have an ultrasound of your abdomen to assess for possible stones. - If you get a fever at the pain worsens to the level it was last night you need to go immediately to the ER because there could be an infection.   ED Prescriptions   None    PDMP not reviewed this encounter.   Floydene Hy, PA-C 09/21/23 [phone]

## 2023-09-22 LAB — CERVICOVAGINAL ANCILLARY ONLY
Bacterial Vaginitis (gardnerella): NEGATIVE
Candida Glabrata: NEGATIVE
Candida Vaginitis: NEGATIVE
Chlamydia: NEGATIVE
Comment: NEGATIVE
Comment: NEGATIVE
Comment: NEGATIVE
Comment: NEGATIVE
Comment: NEGATIVE
Comment: NORMAL
Neisseria Gonorrhea: NEGATIVE
Trichomonas: NEGATIVE

## 2024-03-22 ENCOUNTER — Ambulatory Visit: Payer: Self-pay
